# Patient Record
Sex: Male | Born: 1985 | Race: White | Hispanic: No | Marital: Married | Smoking: Former smoker
Health system: Southern US, Community
[De-identification: ages and names within clinical notes are randomized; demographics above are authoritative.]

## PROBLEM LIST (undated history)

## (undated) DIAGNOSIS — G2581 Restless legs syndrome: Secondary | ICD-10-CM

## (undated) HISTORY — PX: HERNIA REPAIR: SHX51

---
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
## 2003-01-21 ENCOUNTER — Emergency Department (HOSPITAL_COMMUNITY): Admission: EM | Admit: 2003-01-21 | Discharge: 2003-01-21 | Payer: Self-pay | Admitting: Emergency Medicine

## 2003-01-21 ENCOUNTER — Encounter: Payer: Self-pay | Admitting: Emergency Medicine

## 2004-08-28 ENCOUNTER — Encounter: Admission: RE | Admit: 2004-08-28 | Discharge: 2004-08-28 | Payer: Self-pay | Admitting: Occupational Medicine

## 2006-11-03 ENCOUNTER — Emergency Department: Payer: Self-pay

## 2007-02-16 ENCOUNTER — Emergency Department (HOSPITAL_COMMUNITY): Admission: EM | Admit: 2007-02-16 | Discharge: 2007-02-16 | Payer: Self-pay | Admitting: Emergency Medicine

## 2007-11-26 ENCOUNTER — Emergency Department (HOSPITAL_COMMUNITY): Admission: EM | Admit: 2007-11-26 | Discharge: 2007-11-26 | Payer: Self-pay | Admitting: Emergency Medicine

## 2008-05-29 ENCOUNTER — Emergency Department (HOSPITAL_COMMUNITY): Admission: EM | Admit: 2008-05-29 | Discharge: 2008-05-29 | Payer: Self-pay | Admitting: Emergency Medicine

## 2010-02-10 ENCOUNTER — Emergency Department (HOSPITAL_COMMUNITY): Admission: EM | Admit: 2010-02-10 | Discharge: 2010-02-10 | Payer: Self-pay | Admitting: Family Medicine

## 2010-09-13 ENCOUNTER — Inpatient Hospital Stay (INDEPENDENT_AMBULATORY_CARE_PROVIDER_SITE_OTHER)
Admission: RE | Admit: 2010-09-13 | Discharge: 2010-09-13 | Disposition: A | Discharge status: Home / Self Care | Payer: PRIVATE HEALTH INSURANCE | Source: Ambulatory Visit | Attending: Family Medicine | Admitting: Family Medicine

## 2010-09-13 DIAGNOSIS — T148XXA Other injury of unspecified body region, initial encounter: Secondary | ICD-10-CM

## 2012-06-14 HISTORY — PX: VASECTOMY: SHX75

## 2013-01-22 ENCOUNTER — Encounter (HOSPITAL_COMMUNITY): Payer: Self-pay

## 2013-01-22 ENCOUNTER — Emergency Department (HOSPITAL_COMMUNITY)
Admission: EM | Admit: 2013-01-22 | Discharge: 2013-01-22 | Disposition: A | Discharge status: Home / Self Care | Payer: 59 | Source: Home / Self Care

## 2013-01-22 ENCOUNTER — Emergency Department (INDEPENDENT_AMBULATORY_CARE_PROVIDER_SITE_OTHER): Payer: 59

## 2013-01-22 DIAGNOSIS — M25569 Pain in unspecified knee: Secondary | ICD-10-CM

## 2013-01-22 DIAGNOSIS — M25562 Pain in left knee: Secondary | ICD-10-CM

## 2013-01-22 MED ORDER — TRAMADOL HCL 50 MG PO TABS: 50.0000 mg | ORAL_TABLET | Freq: Four times a day (QID) | ORAL | Status: DC | PRN

## 2013-01-22 MED ORDER — IBUPROFEN 800 MG PO TABS: 800.0000 mg | ORAL_TABLET | Freq: Three times a day (TID) | ORAL | Status: DC | PRN

## 2013-01-22 MED ORDER — DOXYCYCLINE HYCLATE 100 MG PO CAPS: 100.0000 mg | ORAL_CAPSULE | Freq: Two times a day (BID) | ORAL | Status: DC

## 2013-01-22 NOTE — ED Notes (Signed)
C/o pain and swelling in his left knee x 1 week; no known injury

## 2013-01-22 NOTE — ED Notes (Signed)
Discussed medication compliance and precautions

## 2013-01-22 NOTE — ED Provider Notes (Signed)
CSN: [id number]     Arrival date & time 01/22/13  1641 History     First MD Initiated Contact with Patient 01/22/13 1704     Chief Complaint  Patient presents with  . Knee Pain   (Consider location/radiation/quality/duration/timing/severity/associated sxs/prior Treatment) HPI Comments: 27 year old male presents complaining of progressive knee pain and swelling in his left lower knee for one week. The pain is severe constant and radiates up towards his groin. It is worse at night, feeling like he has needles stabbing into his thigh. The pain in his knee is worse with any ambulation and is somewhat relieved by rest. He is taking numerous over-the-counter anti-inflammatories without relief of his symptoms. He denies any previous history of similar symptoms, fever, chills, night sweats, weight loss.  Patient is a 27 y.o. male presenting with knee pain.  Knee Pain Associated symptoms: no fatigue, no fever and no neck pain     History reviewed. No pertinent past medical history. History reviewed. No pertinent past surgical history. No family history on file. History  Substance Use Topics  . Smoking status: Not on file  . Smokeless tobacco: Not on file  . Alcohol Use: Not on file    Review of Systems  Constitutional: Negative for fever, chills and fatigue.  HENT: Negative for sore throat, neck pain and neck stiffness.   Eyes: Negative for visual disturbance.  Respiratory: Negative for cough and shortness of breath.   Cardiovascular: Negative for chest pain, palpitations and leg swelling.  Gastrointestinal: Negative for nausea, vomiting, abdominal pain, diarrhea and constipation.  Genitourinary: Negative for dysuria, urgency, frequency and hematuria.  Musculoskeletal: Positive for arthralgias (see history of present illness). Negative for myalgias.  Skin: Negative for rash.  Neurological: Negative for dizziness, weakness and light-headedness.    Allergies  Review of patient's  allergies indicates no known allergies.  Home Medications   Current Outpatient Rx  Name  Route  Sig  Dispense  Refill  . doxycycline (VIBRAMYCIN) 100 MG capsule   Oral   Take 1 capsule (100 mg total) by mouth 2 (two) times daily.   20 capsule   0   . ibuprofen (ADVIL,MOTRIN) 800 MG tablet   Oral   Take 1 tablet (800 mg total) by mouth 3 (three) times daily as needed for pain.   60 tablet   0   . traMADol (ULTRAM) 50 MG tablet   Oral   Take 1 tablet (50 mg total) by mouth every 6 (six) hours as needed for pain.   30 tablet   0    BP 140/87  Pulse 75  Temp(Src) 99.3 F (37.4 C) (Oral)  Resp 18  SpO2 95% Physical Exam  Nursing note and vitals reviewed. Constitutional: He is oriented to person, place, and time. He appears well-developed and well-nourished. No distress.  HENT:  Head: Normocephalic and atraumatic.  Eyes: Conjunctivae are normal. Pupils are equal, round, and reactive to light.  Musculoskeletal:       Left knee: He exhibits swelling (tibial plateau), erythema and bony tenderness (tibial plateau). He exhibits normal range of motion, no effusion and no ecchymosis. No tenderness found.       Left ankle: He exhibits no swelling.       Left upper leg: He exhibits no tenderness, no swelling, no edema and no deformity.       Left foot: He exhibits no swelling.  Neurological: He is alert and oriented to person, place, and time. Coordination normal.  Skin: Skin is  warm and dry. No rash noted. He is not diaphoretic.  Psychiatric: He has a normal mood and affect. Judgment normal.    ED Course   Procedures (including critical care time)  Labs Reviewed - No data to display Dg Knee Complete 4 Views Left  01/22/2013   *RADIOLOGY REPORT*  Clinical Data: Pain and swelling  LEFT KNEE - COMPLETE 4+ VIEW  Comparison: None.  Findings: Frontal, lateral, and bilateral oblique views were obtained.  There is no fracture, dislocation, or effusion.  Joint spaces appear intact.  No  erosive change.  IMPRESSION: No abnormality noted.   Original Report Authenticated By: Bretta Bang, M.D.   Ultrasound performed by Dr. Denyse Amass: This appears to be a resolving hematoma or infrapatellar bursitis.  1. Knee pain, left     MDM  The erythema and tenderness associated with this condition may indicate infection. I will start him on doxycycline as well as symptomatic management. If this does not improve, he will followup here or with orthopedics. If this worsens, he will followup here or in the emergency department immediately.   Meds ordered this encounter  Medications  . doxycycline (VIBRAMYCIN) 100 MG capsule    Sig: Take 1 capsule (100 mg total) by mouth 2 (two) times daily.    Dispense:  20 capsule    Refill:  0  . traMADol (ULTRAM) 50 MG tablet    Sig: Take 1 tablet (50 mg total) by mouth every 6 (six) hours as needed for pain.    Dispense:  30 tablet    Refill:  0  . ibuprofen (ADVIL,MOTRIN) 800 MG tablet    Sig: Take 1 tablet (800 mg total) by mouth 3 (three) times daily as needed for pain.    Dispense:  60 tablet    Refill:  0     Graylon Good, PA-C 01/22/13 1818

## 2013-01-22 NOTE — ED Provider Notes (Signed)
Limited musculoskeletal ultrasound of the left knee mass.  Anterior mass located.  Ultrasound probe placed over the mass. Appears to be a heterogeneous mass approximately 3 cm in length and 1 cm in depth. This is superficial to the bone. No calcifications within the mass.  The appearance of this is characteristic of resolving hematoma.  Clementeen Graham, MD  Rodolph Bong, MD 01/22/13 [phone]

## 2013-01-25 NOTE — ED Provider Notes (Signed)
Medical screening examination/treatment/procedure(s) were performed by a resident physician or non-physician practitioner and as the supervising physician I was immediately available for consultation/collaboration.  Kennie Snedden, MD   Lynnann Knudsen S Nahomy Limburg, MD 01/25/13 1447 

## 2013-03-01 ENCOUNTER — Ambulatory Visit: Payer: Self-pay | Admitting: Family Medicine

## 2013-03-01 DIAGNOSIS — Z0289 Encounter for other administrative examinations: Secondary | ICD-10-CM

## 2013-03-23 ENCOUNTER — Encounter: Payer: Self-pay | Admitting: Family Medicine

## 2013-03-23 ENCOUNTER — Ambulatory Visit (INDEPENDENT_AMBULATORY_CARE_PROVIDER_SITE_OTHER): Payer: 59 | Admitting: Family Medicine

## 2013-03-23 VITALS — BP 136/78 | HR 75 | Temp 98.0°F | Ht 72.0 in | Wt 213.5 lb

## 2013-03-23 DIAGNOSIS — F17200 Nicotine dependence, unspecified, uncomplicated: Secondary | ICD-10-CM

## 2013-03-23 DIAGNOSIS — M25569 Pain in unspecified knee: Secondary | ICD-10-CM

## 2013-03-23 DIAGNOSIS — M2550 Pain in unspecified joint: Secondary | ICD-10-CM

## 2013-03-23 DIAGNOSIS — G4700 Insomnia, unspecified: Secondary | ICD-10-CM

## 2013-03-23 DIAGNOSIS — R5381 Other malaise: Secondary | ICD-10-CM

## 2013-03-23 DIAGNOSIS — R5383 Other fatigue: Secondary | ICD-10-CM | POA: Insufficient documentation

## 2013-03-23 DIAGNOSIS — M25562 Pain in left knee: Secondary | ICD-10-CM

## 2013-03-23 DIAGNOSIS — N529 Male erectile dysfunction, unspecified: Secondary | ICD-10-CM | POA: Insufficient documentation

## 2013-03-23 DIAGNOSIS — Z720 Tobacco use: Secondary | ICD-10-CM

## 2013-03-23 MED ORDER — OXYCODONE-ACETAMINOPHEN 5-325 MG PO TABS: 1.0000 | ORAL_TABLET | ORAL | Status: DC | PRN

## 2013-03-23 MED ORDER — BUPROPION HCL ER (SMOKING DET) 150 MG PO TB12: 150.0000 mg | ORAL_TABLET | Freq: Two times a day (BID) | ORAL | Status: DC

## 2013-03-23 MED ORDER — OXYCODONE-ACETAMINOPHEN 10-325 MG PO TABS: 1.0000 | ORAL_TABLET | Freq: Three times a day (TID) | ORAL | Status: DC | PRN

## 2013-03-23 NOTE — Progress Notes (Signed)
Subjective:    Patient ID: Jesse Rubio, male    DOB: 05-Dec-1985, 27 y.o.   MRN: [id number]  HPI  27 yo pleasant male presents with his wife to discuss several issues:  1.  Left knee pain- in 01/2013, woke up and noticed a very painful mass on his left knee.  It was red and it appeared the redness and pain were moving up his thigh. Went to [facility]- note reviewed. Saw Dr. Clementeen Graham on 01/22/2013.  Knee xray unremarkable but knee ultrasound showed 3 cm x 3 cm hematoma.  Given course of doxycyline, Tramdol and Iubprofen.  Nothing has helped.  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 It has been less red but and somewhat less swollen but otherwise has not changed.  Pain wakes him up at night at times.  .Dg Knee Complete 4 Views Left  01/22/2013 *RADIOLOGY REPORT* Clinical Data: Pain and swelling LEFT KNEE - COMPLETE 4+ VIEW Comparison: None. Findings: Frontal, lateral, and bilateral oblique views were obtained. There is no fracture, dislocation, or effusion. Joint spaces appear intact. No erosive change. IMPRESSION: No abnormality noted.    2.  Polyarthralgia- for months, joint pain, intermittent in elbows, knees, feet, hips.  Also has been very fatigued.  No joint redness.  Mom has OA but no known rheum disorders in family.  3.  Fatigue- "very tired all the time" for over a year.  Not sleeping well- takes a sleep aid like Tylenol PM and usually wakes up once or twice a night.  He does snore. No night sweats.  No SOB. No DOE. Wife has never heard him "stop breathing."  4.  Tobacco abuse- 1 ppd for over 10 years.  Has tried to quit- tried chantix but it gave him bad dreams.  Also has tired patches with no success.  He is ready to quit.  5.  ED- upon questioning for his fatigue- endorses progressive ED for past year.   No longer gets morning erections.  Able to get an erection but often cannot maintain one, even with masturbation.   Feels his relationship  with his wife is healthy.  Patient Active Problem List   Diagnosis Date Noted  . Tobacco abuse 03/23/2013  . Polyarthralgia 03/23/2013  . Other malaise and fatigue 03/23/2013  . Insomnia 03/23/2013  . Erectile dysfunction 03/23/2013  . Left knee pain 03/23/2013   No past medical history on file. Past Surgical History  Procedure Laterality Date  . Vasectomy  2014   History  Substance Use Topics  . Smoking status: Current Every Day Smoker -- 1.00 packs/day  . Smokeless tobacco: Not on file  . Alcohol Use: Yes     Comment: occasional   No family history on file. Allergies  Allergen Reactions  . Chantix [Varenicline]     Disturbing dreams   No current outpatient prescriptions on file prior to visit.   No current facility-administered medications on file prior to visit.   The PMH, PSH, Social History, Family History, Medications, and allergies have been reviewed in [facility], and have been updated if relevant.   Review of Systems  Constitutional: Positive for fatigue. Negative for fever, chills, diaphoresis, activity change, appetite change and unexpected weight change.  Respiratory: Negative for choking and chest tightness.   Cardiovascular: Negative for chest pain and leg swelling.  Gastrointestinal: Negative for abdominal distention.  Genitourinary: Negative for dysuria, discharge, penile swelling, scrotal swelling, difficulty urinating,  penile pain and testicular pain.  Musculoskeletal: Positive for arthralgias and back pain. Negative for gait problem, joint swelling and myalgias.  Psychiatric/Behavioral: Negative.       See HPI Objective:   Physical Exam BP 136/78  Pulse 75  Temp(Src) 98 F (36.7 C) (Oral)  Ht 6' (1.829 m)  Wt 213 lb 8 oz (96.843 kg)  BMI 28.95 kg/m2  SpO2 98% Gen:  Alert, pleasant, NAD HEENT:  MMM No adenopathy Resp:  CTA bilaterally CVS:  RRR Abd:  Soft, NT Musculoskeletal:  Left knee: swelling of left tibial plateau, erythema, mild bony  tenderness, otherwise unremarkable.  Ext:  No edema Psych:  Good eye contact, not anxious or depressed appearing Neuro:  Normal gait, CN grossly intact      Assessment & Plan:  1. Tobacco abuse Smoking cessation instruction/counseling given:  counseled patient on the dangers of tobacco use, advised patient to stop smoking, and reviewed strategies to maximize success He does agree to trial of Zyban- eRx sent to pharmacy.  2. Polyarthralgia Given polyarthralgia with complaints of fatigue, we do need to rule out rheum arthritis.  Orders entered. - CBC with Differential; Future - ANA; Future - Comprehensive metabolic panel; Future - Cyclic Citrul Peptide Antibody, IGG; Future - Sedimentation Rate; Future - Rheumatoid Factor; Future  3. Other malaise and fatigue Likely multifactorial but needs to be worked up.  See above. - CBC with Differential; Future - ANA; Future - Comprehensive metabolic panel; Future - Cyclic Citrul Peptide Antibody, IGG; Future - Sedimentation Rate; Future - Rheumatoid Factor; Future  4. Insomnia With snoring.  Will order labs first.  If unremarkable, consider sleep study. The patient indicates understanding of these issues and agrees with the plan.   5. Erectile dysfunction Concerning for organic impotence given that he is no longer having morning erections and difficulty maintaining orgasm even with self stimulation.  Will check am testosterone and prolactin.  May need endo referral. The patient indicates understanding of these issues and agrees with the plan.  - Testosterone; Future - Prolactin; Future  6. Left knee pain With hematoma.  Will refer to Dr. Patsy Lager for evaluation and tx. Did given him #20 percocet for severe pain.

## 2013-03-23 NOTE — Patient Instructions (Addendum)
It was nice to meet you. Please schedule an early morning( fasting) lab visit at your convenience. Please also schedule an appointment with Dr. Patsy Lager on your way out.  We are starting Zyban for smoking cessation- I am proud of you for wanting to quit!  Ok to take as need Percocet for severe pain.  This will make you sleepy.  Please do not take before driving or operating machinery.

## 2013-03-26 ENCOUNTER — Other Ambulatory Visit: Payer: 59

## 2013-03-28 ENCOUNTER — Ambulatory Visit: Payer: 59

## 2013-03-28 ENCOUNTER — Other Ambulatory Visit (INDEPENDENT_AMBULATORY_CARE_PROVIDER_SITE_OTHER): Payer: 59

## 2013-03-28 ENCOUNTER — Encounter: Payer: Self-pay | Admitting: Family Medicine

## 2013-03-28 ENCOUNTER — Ambulatory Visit (INDEPENDENT_AMBULATORY_CARE_PROVIDER_SITE_OTHER): Payer: 59 | Admitting: Family Medicine

## 2013-03-28 ENCOUNTER — Encounter: Payer: Self-pay | Admitting: Radiology

## 2013-03-28 VITALS — BP 110/90 | HR 62 | Temp 98.1°F | Ht 72.0 in | Wt 210.0 lb

## 2013-03-28 DIAGNOSIS — M25569 Pain in unspecified knee: Secondary | ICD-10-CM

## 2013-03-28 DIAGNOSIS — R5381 Other malaise: Secondary | ICD-10-CM

## 2013-03-28 DIAGNOSIS — M2550 Pain in unspecified joint: Secondary | ICD-10-CM

## 2013-03-28 DIAGNOSIS — N529 Male erectile dysfunction, unspecified: Secondary | ICD-10-CM

## 2013-03-28 DIAGNOSIS — R5383 Other fatigue: Secondary | ICD-10-CM

## 2013-03-28 DIAGNOSIS — M25562 Pain in left knee: Secondary | ICD-10-CM

## 2013-03-28 DIAGNOSIS — G4700 Insomnia, unspecified: Secondary | ICD-10-CM

## 2013-03-28 LAB — CBC WITH DIFFERENTIAL/PLATELET
Basophils Absolute: 0 10*3/uL (ref 0.0–0.1)
Basophils Relative: 0.5 % (ref 0.0–3.0)
Eosinophils Absolute: 0.4 10*3/uL (ref 0.0–0.7)
Lymphocytes Relative: 29.4 % (ref 12.0–46.0)
MCHC: 34.5 g/dL (ref 30.0–36.0)
Neutrophils Relative %: 59.5 % (ref 43.0–77.0)
Platelets: 383 10*3/uL (ref 150.0–400.0)
RBC: 4.91 Mil/uL (ref 4.22–5.81)
RDW: 12.7 % (ref 11.5–14.6)

## 2013-03-28 LAB — COMPREHENSIVE METABOLIC PANEL
ALT: 29 U/L (ref 0–53)
AST: 27 U/L (ref 0–37)
Albumin: 4.3 g/dL (ref 3.5–5.2)
Alkaline Phosphatase: 71 U/L (ref 39–117)
Potassium: 4.5 mEq/L (ref 3.5–5.1)
Sodium: 140 mEq/L (ref 135–145)
Total Protein: 8.1 g/dL (ref 6.0–8.3)

## 2013-03-28 LAB — TESTOSTERONE: Testosterone: 321.18 ng/dL — ABNORMAL LOW (ref 350.00–890.00)

## 2013-03-28 NOTE — Progress Notes (Signed)
Date:  03/28/2013   Name:  Jesse Rubio   DOB:  [birth year]   MRN:  [id number] Gender: male Age: 27 y.o.  Primary Physician: Ruthe Mannan, MD  Dear Dr. Dayton Martes,  Thank you for having me see Jesse Rubio in consultation today at [facility] for his problem with knee pain that has been ongoing now for 3 months.  As you may recall, he is a 27 y.o. 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 On 01/22/2013, the patient was evaluated at [facility] urgent care, placed on 10 days of doxycycline, given ibuprofen, and ultram.    Per the patient's report, about three or 4 months ago, he did not hit it and had a knot on his knee and started to grow and hurting. His wife describes this initially looking like a "pimple." Subsequently, they report that it become larger, more painful, and it got red and hot to touch. Now pain is staying just in his kneecap. Pain shooting up leg has gone away. Knee still hurts, but he has been able to continue working.   It did improve after taking motrin and doxycycline.   No significant knee history, fracture, or surgery.  No past medical history on file.  Past Surgical History  Procedure Laterality Date  . Vasectomy  2014    History   Social History  . Marital Status: Married    Spouse Name: N/A    Number of Children: N/A  . Years of Education: N/A   Social History Main Topics  . Smoking status: Current Every Day Smoker -- 1.00 packs/day  . Smokeless tobacco: Former Neurosurgeon  . Alcohol Use: Yes     Comment: occasional  . Drug Use: No  . Sexual Activity: Not on file   Other Topics Concern  . Not on file   Social History Narrative  . No narrative on file    No family history on file.  Medications and Allergies reviewed  Outpatient Prescriptions Prior to Visit  Medication Sig Dispense Refill  . buPROPion (ZYBAN) 150 MG 12 hr tablet Take 1 tablet (150 mg total) by mouth 2 (two)  times daily.  60 tablet  0  . oxyCODONE-acetaminophen (ROXICET) 5-325 MG per tablet Take 1 tablet by mouth every 4 (four) hours as needed for pain.  30 tablet  0   No facility-administered medications prior to visit.    Review of Systems:    GEN: No fevers, chills. Nontoxic. Primarily MSK c/o today. MSK: Detailed in the HPI, he also has been having diffuse arthralgias and myalgias over the last few months. GI: tolerating PO intake without difficulty Neuro: No numbness, parasthesias, or tingling associated. He also has been having a lot of stress Currently with some libido issues and occ ED also Otherwise, the pertinent positives and negatives are listed above and in the HPI, otherwise a full review of systems has been reviewed and is negative unless noted positive.     Physical Examination: Filed Vitals:   03/28/13 0901  BP: 110/90  Pulse: 62  Temp: 98.1 F (36.7 C)  TempSrc: Oral  Height: 6' (1.829 m)  Weight: 210 lb (95.255 kg)    GEN: WDWN, NAD, Non-toxic, Alert & Oriented x 3 HEENT: Atraumatic, Normocephalic.  Ears and Nose: No external deformity. EXTR: No clubbing/cyanosis/edema NEURO: Normal gait.  PSYCH: Normally interactive. Conversant. Not depressed or anxious appearing.  Calm demeanor.  Knee:  LEFT Gait: Normal heel toe pattern ROM: 0-130 Effusion: neg Echymosis or edema: none Tibial tubercle does have some slight color change, no swelling, minimally to non-tender currently Patellar tendon NT Painful PLICA: neg Patellar grind: mild Medial and lateral patellar facet loading: mild TTP medial and lateral joint lines: mild tenderness Mcmurray's neg Flexion-pinch mild-mod pain Varus and valgus stress: stable Lachman: neg Ant and Post drawer: neg Hip abduction, IR, ER: WNL Hip flexion str: 5/5 Hip abd: 5/5 Quad: 5/5 VMO atrophy:No Hamstring concentric and eccentric: 5/5    01/22/2013 Imaging: Limited musculoskeletal ultrasound of the left knee mass.     Anterior mass located.   Ultrasound probe placed over the mass. Appears to be a heterogeneous mass approximately 3 cm in length and 1 cm in depth. This is superficial to the bone. No calcifications within the mass.   The appearance of this is characteristic of resolving hematoma.  Clementeen Graham, MD   Rodolph Bong, MD 01/22/2013   *RADIOLOGY REPORT*  Clinical Data: Pain and swelling  LEFT KNEE - COMPLETE 4+ VIEW  Comparison: None.  Findings: Frontal, lateral, and bilateral oblique views were obtained.  There is no fracture, dislocation, or effusion.  Joint spaces appear intact.  No erosive change.  IMPRESSION: No abnormality noted.   Original Report Authenticated By: Bretta Bang, M.D.    Ultrasound performed by Dr. Denyse Amass: This appears to be a resolving hematoma or infrapatellar bursitis. Limited musculoskeletal ultrasound of the left knee mass.   Anterior mass located.   Ultrasound probe placed over the mass. Appears to be a heterogeneous mass approximately 3 cm in length and 1 cm in depth. This is superficial to the bone. No calcifications within the mass.   The appearance of this is characteristic of resolving hematoma.  Clementeen Graham, MD   Rodolph Bong, MD 01/22/2013   *RADIOLOGY REPORT*  Clinical Data: Pain and swelling  LEFT KNEE - COMPLETE 4+ VIEW  Comparison: None.  Findings: Frontal, lateral, and bilateral oblique views were obtained.  There is no fracture, dislocation, or effusion.  Joint spaces appear intact.  No erosive change.  IMPRESSION: No abnormality noted.   Original Report Authenticated By: Bretta Bang, M.D.    Ultrasound performed by Dr. Denyse Amass: This appears to be a resolving hematoma or infrapatellar bursitis.  Results for orders placed in visit on 03/28/13  B. BURGDORFI ANTIBODIES      Result Value Range   B burgdorferi Ab IgG+IgM 0.46     Lab Results  Component Value Date   CRP 1.6 03/28/2013    Lab Results  Component Value Date   ESRSEDRATE 10 03/28/2013    Lab Results  Component Value Date   ANA NEG 03/28/2013   RF <10 03/28/2013    Comprehensive Metabolic Panel:    Component Value Date/Time   NA 140 03/28/2013 0839   K 4.5 03/28/2013 0839   CL 104 03/28/2013 0839   CO2 28 03/28/2013 0839   BUN 12 03/28/2013 0839   CREATININE 1.0 03/28/2013 0839   GLUCOSE 116* 03/28/2013 0839   CALCIUM 9.6 03/28/2013 0839   AST 27 03/28/2013 0839   ALT 29 03/28/2013 0839   ALKPHOS 71 03/28/2013 0839   BILITOT 0.8 03/28/2013 0839   PROT 8.1 03/28/2013 0839   ALBUMIN 4.3 03/28/2013 0839   CCP < 2, normal  Assessment and Plan:  Impression: 1. Ongoing left knee pain 2. Diffuse polyarthralgias 3. I question given history if initial presentation may have been  an abscess.   Recommendations: 1. Add CRP and Lyme titers to pending blood in lab, normal 2. Obtain an MRI of the left knee to evaluate for any cartilage injury, meniscal injury, T2 signal of bone, which could suggest underlying infectious process.   We will see the patient back based on the results of his imaging.  Thank you for having [place] see Jesse Rubio in consultation.  Feel free to contact me with any questions.  Signed,  Elpidio Galea. Anelia Carriveau, MD, CAQ Sports Medicine  [employer] at [facility] [address] [place], [place] [address] Phone: [phone] Fax: [phone]

## 2013-03-29 LAB — PROLACTIN: Prolactin: 9.5 ng/mL (ref 2.1–17.1)

## 2013-03-29 LAB — B. BURGDORFI ANTIBODIES: B burgdorferi Ab IgG+IgM: 0.46 {ISR}

## 2013-03-29 LAB — ANA: Anti Nuclear Antibody(ANA): NEGATIVE

## 2013-03-30 ENCOUNTER — Other Ambulatory Visit: Payer: Self-pay | Admitting: Family Medicine

## 2013-03-30 ENCOUNTER — Encounter: Payer: Self-pay | Admitting: Family Medicine

## 2013-03-30 DIAGNOSIS — E291 Testicular hypofunction: Secondary | ICD-10-CM

## 2013-04-13 ENCOUNTER — Ambulatory Visit
Admission: RE | Admit: 2013-04-13 | Discharge: 2013-04-13 | Disposition: A | Discharge status: Home / Self Care | Payer: 59 | Source: Ambulatory Visit | Attending: Family Medicine | Admitting: Family Medicine

## 2013-04-13 DIAGNOSIS — M25562 Pain in left knee: Secondary | ICD-10-CM

## 2013-04-13 DIAGNOSIS — M2550 Pain in unspecified joint: Secondary | ICD-10-CM

## 2013-04-19 ENCOUNTER — Other Ambulatory Visit: Payer: Self-pay

## 2013-04-20 ENCOUNTER — Encounter: Payer: Self-pay | Admitting: Radiology

## 2013-04-23 ENCOUNTER — Ambulatory Visit: Payer: 59 | Admitting: Family Medicine

## 2013-04-23 DIAGNOSIS — Z0289 Encounter for other administrative examinations: Secondary | ICD-10-CM

## 2013-04-29 ENCOUNTER — Other Ambulatory Visit: Payer: Self-pay | Admitting: Family Medicine

## 2013-07-01 ENCOUNTER — Emergency Department (HOSPITAL_COMMUNITY)
Admission: EM | Admit: 2013-07-01 | Discharge: 2013-07-01 | Disposition: A | Discharge status: Home / Self Care | Payer: 59 | Attending: Emergency Medicine | Admitting: Emergency Medicine

## 2013-07-01 ENCOUNTER — Encounter (HOSPITAL_COMMUNITY): Payer: Self-pay | Admitting: Emergency Medicine

## 2013-07-01 DIAGNOSIS — F17200 Nicotine dependence, unspecified, uncomplicated: Secondary | ICD-10-CM | POA: Insufficient documentation

## 2013-07-01 DIAGNOSIS — L089 Local infection of the skin and subcutaneous tissue, unspecified: Secondary | ICD-10-CM

## 2013-07-01 DIAGNOSIS — H05229 Edema of unspecified orbit: Secondary | ICD-10-CM | POA: Insufficient documentation

## 2013-07-01 DIAGNOSIS — Z79899 Other long term (current) drug therapy: Secondary | ICD-10-CM | POA: Insufficient documentation

## 2013-07-01 DIAGNOSIS — L03211 Cellulitis of face: Principal | ICD-10-CM | POA: Insufficient documentation

## 2013-07-01 DIAGNOSIS — L0201 Cutaneous abscess of face: Secondary | ICD-10-CM | POA: Insufficient documentation

## 2013-07-01 MED ORDER — CEPHALEXIN 500 MG PO CAPS: 500.0000 mg | ORAL_CAPSULE | Freq: Two times a day (BID) | ORAL | Status: DC

## 2013-07-01 NOTE — ED Notes (Signed)
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  Pt states he's been picking at it.  Day before yesterday he was able to "mash" it and reports a large amount of pus came out of it.  This morning pt woke up and has L eye swelling.

## 2013-07-01 NOTE — ED Provider Notes (Signed)
CSN: [id number]     Arrival date & time 07/01/13  0918 History  This chart was scribed for non-physician practitioner, Vernie Murders, PA-C working with Tanna Furry, MD by Frederich Balding, ED scribe. This patient was seen in room TR06C/TR06C and the patient's care was started at 9:44 AM.   Chief Complaint  Patient presents with  . Facial Swelling   The history is provided by the patient. No language interpreter was used.   HPI Comments: Jesse Rubio is a 28 y.o. male who presents to the Emergency Department complaining of a painless mass above his left eyebrow, which has been present for several years.  Patient states that two days ago he tried to "pop" the mass with a needle.  He describes a large amount of yellow pus drained from the wound. He states he now has developed overlying redness, swelling, and pain.  He states the swelling has now spread to his left eye.  He denies any fever, eye pain, ear pain, headache, sore throat, neck pain, rhinorrhea, or nasal congestion.  He denies any hx of abscesses in the past.  No hx of DM or other health conditions.  Tetanus up to date.    No past medical history on file. Past Surgical History  Procedure Laterality Date  . Vasectomy  2014   No family history on file. History  Substance Use Topics  . Smoking status: Current Every Day Smoker -- 1.00 packs/day  . Smokeless tobacco: Former Systems developer  . Alcohol Use: Yes     Comment: occasional    Review of Systems  Constitutional: Negative for fever, chills, activity change, appetite change and fatigue.  HENT: Positive for facial swelling. Negative for congestion, ear pain, rhinorrhea, sore throat and trouble swallowing.   Eyes: Negative for photophobia, pain, discharge, redness and visual disturbance.  Gastrointestinal: Negative for nausea, vomiting and abdominal pain.  Skin: Positive for color change and wound.       Abscess.  Neurological: Negative for weakness and headaches.  All other systems  reviewed and are negative.   Allergies  Chantix  Home Medications   Current Outpatient Rx  Name  Route  Sig  Dispense  Refill  . buPROPion (WELLBUTRIN SR) 150 MG 12 hr tablet      TAKE 1 TABLET (150 MG TOTAL) BY MOUTH 2 (TWO) TIMES DAILY.   60 tablet   2   . oxyCODONE-acetaminophen (ROXICET) 5-325 MG per tablet   Oral   Take 1 tablet by mouth every 4 (four) hours as needed for pain.   30 tablet   0    BP 140/96  Pulse 81  Temp(Src) 97.4 F (36.3 C) (Oral)  Resp 14  Ht 6' (1.829 m)  Wt 210 lb (95.255 kg)  BMI 28.47 kg/m2  SpO2 99%  Filed Vitals:   07/01/13 0923  BP: 140/96  Pulse: 81  Temp: 97.4 F (36.3 C)  TempSrc: Oral  Resp: 14  Height: 6' (1.829 m)  Weight: 210 lb (95.255 kg)  SpO2: 99%    Physical Exam  Nursing note and vitals reviewed. Constitutional: He is oriented to person, place, and time. He appears well-developed and well-nourished. No distress.  HENT:  Head: Normocephalic and atraumatic.    Right Ear: Tympanic membrane, external ear and ear canal normal.  Left Ear: Tympanic membrane, external ear and ear canal normal.  Nose: Nose normal.  Mouth/Throat: Uvula is midline, oropharynx is clear and moist and mucous membranes are normal. No oropharyngeal exudate.  1.5 cm circular fluctuant mass with overlying erythema and no open wounds or evidence of drainage superficial to the left eyebrow. Tympanic membranes gray and translucent bilaterally with no erythema, edema, or hemotympanum.  No erythema or exudates to the posterior pharynx.  Uvula midline.  No trismus.   Eyes: Conjunctivae and EOM are normal. Pupils are equal, round, and reactive to light.  Mild periorbital edema to the left eye with no erythema.  No evidence of foreign body or mass to the eyelids.  No pain with eye movement.    Neck: Normal range of motion. Neck supple. No tracheal deviation present.  No cervical spinal or paraspinal tenderness to palpation throughout.  No limitations  with neck ROM.    Cardiovascular: Normal rate, regular rhythm and normal heart sounds.  Exam reveals no gallop and no friction rub.   No murmur heard. Pulmonary/Chest: Effort normal and breath sounds normal. No respiratory distress. He has no wheezes. He has no rales. He exhibits no tenderness.  Musculoskeletal: Normal range of motion. He exhibits no edema and no tenderness.  Neurological: He is alert and oriented to person, place, and time.  Skin: Skin is warm and dry. He is not diaphoretic.  Psychiatric: He has a normal mood and affect. His behavior is normal.    ED Course  INCISION AND DRAINAGE Date/Time: 07/01/2013 10:00 AM Performed by: Vernie Murders K Authorized by: Lucila Maine Consent: Verbal consent obtained. Consent given by: patient Patient identity confirmed: verbally with patient Type: abscess Body area: head/neck Location details: face Anesthesia: local infiltration Local anesthetic: lidocaine 2% without epinephrine Anesthetic total: 2 ml Patient sedated: no Scalpel size: 11 Incision type: elliptical and single straight Complexity: simple Drainage: bloody Drainage amount: scant Wound treatment: wound left open Patient tolerance: Patient tolerated the procedure well with no immediate complications.   (including critical care time)  DIAGNOSTIC STUDIES: Oxygen Saturation is 99% on RA, normal by my interpretation.    COORDINATION OF CARE: 9:47 AM-Discussed treatment plan which includes I&D with pt at bedside and pt agreed to plan.    Labs Review Labs Reviewed - No data to display Imaging Review No results found.  EKG Interpretation   None       MDM   Jesse Rubio is a 28 y.o. male who presents to the Emergency Department complaining of a painless mass above his left eyebrow, which has been present for several years.  Etiology of mass possibly due to a cyst vs abscess vs tumor. I&D did not reveal any purulent drainage. Patient may be  developing an overlying cellulitis.  Patient prescribed Keflex. Mild edema to the left eye with no erythema.  Do not suspect peri-orbital or orbital cellulitis.  Patient afebrile and non-toxic in appearance.  Patient instructed to follow-up with PCP for wound re-check.  Return precautions, discharge instructions, and follow-up was discussed with the patient before discharge.     Discharge Medication List as of 07/01/2013 10:09 AM    START taking these medications   Details  cephALEXin (KEFLEX) 500 MG capsule Take 1 capsule (500 mg total) by mouth 2 (two) times daily., Starting 07/01/2013, Until Discontinued, Print         Final impressions: 1. Facial infection     Denman George   I personally performed the services described in this documentation, which was scribed in my presence. The recorded information has been reviewed and is accurate.       Lucila Maine, PA-C 07/02/13  1006 

## 2013-07-01 NOTE — Discharge Instructions (Signed)
You may have an abscess or a mass in your forehead which has become infected  Keep area clean and dry  Apply antibiotic ointment as needed  Follow-up with your doctor for wound re-check  Return to the emergency department if you develop any changing/worsening condition, fever, spreading redness/swelling, drainage of pus, or any other concerns (please read additional information regarding your condition below)    Abscess An abscess is an infected area that contains a collection of pus and debris.It can occur in almost any part of the body. An abscess is also known as a furuncle or boil. CAUSES  An abscess occurs when tissue gets infected. This can occur from blockage of oil or sweat glands, infection of hair follicles, or a minor injury to the skin. As the body tries to fight the infection, pus collects in the area and creates pressure under the skin. This pressure causes pain. People with weakened immune systems have difficulty fighting infections and get certain abscesses more often.  SYMPTOMS Usually an abscess develops on the skin and becomes a painful mass that is red, warm, and tender. If the abscess forms under the skin, you may feel a moveable soft area under the skin. Some abscesses break open (rupture) on their own, but most will continue to get worse without care. The infection can spread deeper into the body and eventually into the bloodstream, causing you to feel ill.  DIAGNOSIS  Your caregiver will take your medical history and perform a physical exam. A sample of fluid may also be taken from the abscess to determine what is causing your infection. TREATMENT  Your caregiver may prescribe antibiotic medicines to fight the infection. However, taking antibiotics alone usually does not cure an abscess. Your caregiver may need to make a small cut (incision) in the abscess to drain the pus. In some cases, gauze is packed into the abscess to reduce pain and to continue draining the  area. HOME CARE INSTRUCTIONS   Only take over-the-counter or prescription medicines for pain, discomfort, or fever as directed by your caregiver.  If you were prescribed antibiotics, take them as directed. Finish them even if you start to feel better.  If gauze is used, follow your caregiver's directions for changing the gauze.  To avoid spreading the infection:  Keep your draining abscess covered with a bandage.  Wash your hands well.  Do not share personal care items, towels, or whirlpools with others.  Avoid skin contact with others.  Keep your skin and clothes clean around the abscess.  Keep all follow-up appointments as directed by your caregiver. SEEK MEDICAL CARE IF:   You have increased pain, swelling, redness, fluid drainage, or bleeding.  You have muscle aches, chills, or a general ill feeling.  You have a fever. MAKE SURE YOU:   Understand these instructions.  Will watch your condition.  Will get help right away if you are not doing well or get worse. Document Released: 03/10/2005 Document Revised: 11/30/2011 Document Reviewed: 08/13/2011 [facility] Patient Information 2014 [place], [place].   Emergency Department Resource Guide 1) Find a Doctor and Pay Out of Pocket Although you won't have to find out who is covered by your insurance plan, it is a good idea to ask around and get recommendations. You will then need to call the office and see if the doctor you have chosen will accept you as a new patient and what types of options they offer for patients who are self-pay. Some doctors offer discounts or will  set up payment plans for their patients who do not have insurance, but you will need to ask so you aren't surprised when you get to your appointment.  2) Contact Your Local Health Department Not all health departments have doctors that can see patients for sick visits, but many do, so it is worth a call to see if yours does. If you don't know where your local  health department is, you can check in your phone book. The CDC also has a tool to help you locate your state's health department, and many state websites also have listings of all of their local health departments.  3) Find a [facility] If your illness is not likely to be very severe or complicated, you may want to try a walk in clinic. These are popping up all over the country in pharmacies, drugstores, and shopping centers. They're usually staffed by nurse practitioners or physician assistants that have been trained to treat common illnesses and complaints. They're usually fairly quick and inexpensive. However, if you have serious medical issues or chronic medical problems, these are probably not your best option.  No Primary Care Doctor: - Call Health Connect at  [phone] - they can help you locate a primary care doctor that  accepts your insurance, provides certain services, etc. - Physician Referral Service- [phone]  Chronic Pain Problems: Organization         Address  Phone   Notes  [facility]  [phone] Patients need to be referred by their primary care doctor.   Medication Assistance: Organization         Address  Phone   Notes  [facility] Medication [facility] [address] [place], [place] [address] [phone] --Must be a resident of [facility] -- Must have NO insurance coverage whatsoever (no Medicaid/ Medicare, etc.) -- The pt. MUST have a primary care doctor that directs their care regularly and follows them in the community   [facility]ist  [phone]   [employer]  [phone]    Agencies that provide inexpensive medical care: Organization         Address  Phone   Notes  [place]  [phone]   [facility]    [phone]   [facility] [place], [place] [address] [phone]   [place] [address] [place] [phone]   Planned Parenthood    [phone]   [facility]    [phone]   [place] and [address], [place] Phone:  [phone], Fax:  [phone] Hours of Operation:  9 am - 6 pm, M-F.  Also accepts Medicaid/Medicare and self-pay.  [facility] for [place], [address], [place] Phone: [phone], Fax: [phone]. Hours of Operation:  8:30 am - 5:30 pm, M-F.  Also accepts Medicaid and self-pay.  [facility] [place] [address], [place] Phone: [phone]   [place], [place], [place] [phone], Ext. [phone] Mondays & Thursdays: 7-9 AM.  First 15 patients are seen on a first come, first serve basis.    [place] Providers:  Organization         Address  Phone   Notes  [facility] [address], [place] [phone]  Also accepts self-pay patients.  [facility] [address], [place]  [phone]   [place], [address], [place] [phone]   [facility] [address], [place] [phone]   Lucianne Lei [address], [place]   [phone] Only accepts [place] Access [place] patients after they have their name applied to their card.   Self-Pay (no insurance) in [facility]:  Organization         Address  Phone   Notes  Sickle Cell Patients, [facility] [place] [phone]   [facility] [place] [phone]   [facility] [place]  [place], [place], [place] [phone]   [facility]/Dr. Osei-Bonsu  [address] [place] or [address], [place] [phone] Phone number for both [place] and  [place] locations is the same.  [facility]gent [facility] and [facility] [address] [place] [phone]   [facility] [address], [place] or [address] [phone]   [facility] [address] [place] [phone], phone; [phone], fax Sees patients 1st and 3rd Saturday of every month.  Must not qualify for public or private insurance (i.e. Medicaid, Medicare, [place] Health Choice, Veterans' Benefits)  Household income should be no more than 200% of the poverty level The clinic cannot treat you if you are pregnant or think you are pregnant  Sexually transmitted diseases are not treated at the clinic.    Dental Care: Organization         Address  Phone  Notes  [facility] Department of [facility] [place] [phone] Accepts children up to age 45 who are enrolled in [place] or [place]; pregnant women with a Medicaid card; and children who have applied for Medicaid or [place] Health Choice, but were declined, whose parents can pay a reduced fee at time of service.  [facility] Department of [facility]  [address], [place] [phone] Accepts children up to age 38 who are enrolled in [place] or [place]; pregnant women with a Medicaid card; and children who have applied for Medicaid or [place] Health Choice, but were declined, whose parents can pay a reduced fee at time of service.  [place] Adult Dental Access PROGRAM  [place] [phone] Patients are seen by appointment only. Walk-ins are not accepted. [place] will see patients 32 years of age and older. Monday - Tuesday (8am-5pm) Most Wednesdays (8:30-5pm) $30 per visit, cash only  [facility] Adult Dental Access PROGRAM  [address], [facility] [phone] Patients are seen by appointment only. Walk-ins are not accepted.  [place] will see patients 19 years of age and older. One Wednesday Evening (Monthly: Volunteer Based).  $30 per visit, cash only  [place]  [phone] for adults; Children under age 53, call Graduate Pediatric Dentistry at [phone]. Children aged 39-14, please call [phone] to request a pediatric application.  Dental services are provided in all areas of dental care including fillings, crowns and bridges, complete and partial dentures, implants, gum treatment, root canals, and extractions. Preventive care is also provided. Treatment  is provided to both adults and children. Patients are selected via a lottery and there is often a waiting list.   [facility] [address], [place]  [phone] [email or web address]   [facility] [address], [place], [place] [phone], Ext. [phone] Second and Fourth Thursday of each month, opens at 6:30 AM; Clinic ends at 9 AM.  Patients are seen on a first-come first-served basis, and a limited number are seen during each clinic.   [facility]  [address], [place], [place] [phone]   Eligibility Requirements You must have lived in [place], [place], or [place] counties for at least the last three months.   You cannot be eligible for state or federal sponsored [place] Cityhealthcare insurance, including CIG[employer]eterans Administration, [place]icaid, or [employer] EntertainmentMedicare.   You generally cannot be eligible for healthcare insurance through your employer.    How to apply: Eligibility screenings are held every Tuesday and Wednesday afternoon from 1:00 pm until 4:00 pm. You do not need an appointment for the interview!  [facility] [address], [place], [place] [phone]   [facility]  [phone]   [facility]  [phone]   [facility]  [phone]    Behavioral Health Resources in the  Community: Intensive Outpatient Programs Organization         Address  Phone  Notes  [facility] [address], [place], [place] [phone]   [facility] [address], [place], [place] [phone]   [facility]: [facility] [address], [place], [place]  [phone]   [facility] [address],  [place], [place] [phone] or [phone]   Substance Abuse Resources Organization         Address  Phone  Notes  [facility] Drug Services  [phone]   [facility]  [phone]   [facility]  [phone]   [facility]  [phone]   Residential & Outpatient Substance Abuse Program  [phone]   Psychological Services Organization         Address  Phone  Notes  [facility]  [phone]- [phone]   [facility]  [phone]- [phone]   [facility] [address], [place] [phone] or [phone]    Mobile Crisis Teams Organization         Address  Phone  Notes  [facility]atives, Mobile Crisis Care Unit  [phone]   [facility]  [address] [place], [place] [phone]   Doristine LocksSharon DeEsch [address] [place] [phone]    Self-Help/Support Groups Organization         Address  Phone             Notes  [facility]oc[facility] of [place] - variety of support groups  336- [phone] Call for more information  Narcotics Anonymous (NA), Caring [facility] [address], [place]   2 meetings at this location   Statisticianesidential Treatment Programs Organization         Address  Phone  Notes  [facility] [address],    [place]  [phone]   [facility]  [address], [place] [phone], [place], [place] [phone]   [facility] [address], [place] [phone] Admissions: 8am-3pm M-F  [facility] [address]    [place],  [place] [phone]   [facility] [address], [place], [place] [phone]   [facility] [address]  [place], [place] [phone]   [facility] - Intensive Outpatient [address], [place], [place] [phone]   [facility] ([facility] Recovery [facility] [address]  [place], [place] [phone] or [phone]   [facility] ([facility]) [address] [place], [place] [phone] Accepts Medicaid  [facility] [address]  [place] [phone] Substance Abuse/Addiction Treatment   [facility] Health Resources Organization         Address  Phone  Notes  [facility]  [phone]   Angie FavaJulie Brannon, PhD [address], [address] [place], [place]   [phone] or [phone]   [facility]   [address] [place], [place] [phone]) [phone]   [facility] [address], [place], [place] [phone]) [phone] Insurance/Medicaid/sponsorship through [facility]  [facility] Families [address]                                    [place], [place] [phone]) [phone] Therapy/tele-psych/case  [facility] [address].   [place], [place] [phone](336) [phone]    Dr. Lolly MustacheArfeen  [phone]   [facility]  [employer] [facility] 1) [address], [place] 2) [address], [place] 3)  [address], [place] [phone](336) [phone]  [phone]   [facility] Child Abuse Hotline [phone]([phone] or [phone](336) [phone] (After Hours)

## 2013-07-02 ENCOUNTER — Telehealth: Payer: Self-pay

## 2013-07-02 NOTE — Telephone Encounter (Signed)
Triage Record Num: [id number] Operator: Domingo Madeira Patient Name: Jesse Rubio Call Date & Time: 07/01/2013 8:30:39AM Patient Phone: [phone] PCP: Patient Gender: Male PCP Fax : Patient DOB: [birth year] Practice Name: [place] Reason for Call: Caller: Jennifer/Spouse; PCP: Arnette Norris (Family Practice); CB#: [phone]; Call regarding Had a small hard bump on forehead, and puss came out, this AM his eye is swollen; Afebrile. Onset 06/29/2013 bump on his forehead, he squeezed it and wife said there was a lot of pus draining from it and blood, the release of pressure felt better. His left eye the next day, 06/30/2013 slightly swollen and now today, 07/01/2013 the left eye is swollen shut. The bump on forehead is really red, not swollen. All emergent signs and symptoms of Face Pain or Swelling protocol ruled out except 'Worsening redness, swelling AND tenderness of tissue around eyes associ8ated with restricted or painful eye movements, bulging eye, or decreased vision related to swelling. RN/CAN advised ER now and he will have [facility]. Protocol(s) Used: Face Pain or Swelling Recommended Outcome per Protocol: See Provider Immediately Reason for Outcome: Worsening redness, swelling AND tenderness of tissue around eyes associated with restricted or painful eye movements, bulging eye, or decreased vision related to swelling Care Advice: ~ Another adult should drive. ~ CAUTIONS Write down provider's name. List or place the following in a bag for transport with the patient: current prescription and/or nonprescription medications; alternative treatments, therapies and medications; and street drugs. ~ 01/

## 2013-07-04 NOTE — ED Provider Notes (Signed)
Medical screening examination/treatment/procedure(s) were performed by non-physician practitioner and as supervising physician I was immediately available for consultation/collaboration.  EKG Interpretation   None         Tanna Furry, MD 07/04/13 1515

## 2013-07-12 ENCOUNTER — Ambulatory Visit (INDEPENDENT_AMBULATORY_CARE_PROVIDER_SITE_OTHER): Payer: 59 | Admitting: Family Medicine

## 2013-07-12 ENCOUNTER — Encounter: Payer: Self-pay | Admitting: Family Medicine

## 2013-07-12 VITALS — BP 116/84 | HR 72 | Temp 98.7°F | Wt 217.5 lb

## 2013-07-12 DIAGNOSIS — G8929 Other chronic pain: Secondary | ICD-10-CM

## 2013-07-12 DIAGNOSIS — M545 Low back pain, unspecified: Secondary | ICD-10-CM | POA: Insufficient documentation

## 2013-07-12 MED ORDER — CYCLOBENZAPRINE HCL 10 MG PO TABS: 10.0000 mg | ORAL_TABLET | Freq: Two times a day (BID) | ORAL | Status: DC | PRN

## 2013-07-12 MED ORDER — NAPROXEN 500 MG PO TABS: ORAL_TABLET | ORAL | Status: DC

## 2013-07-12 NOTE — Patient Instructions (Signed)
I think you have lower back pain from tight muscles with spasm as well as a chronic lumbar strain You may have some arthritis of your sacro iliac joint. Do stretching exercises provided along with using prescription anti inflammatory and muscle relaxant. If not improving with this, let us know for possible therapy versus imaging.

## 2013-07-12 NOTE — Progress Notes (Signed)
Pre-visit discussion using our clinic review tool. No additional management support is needed unless otherwise documented below in the visit note.  

## 2013-07-12 NOTE — Assessment & Plan Note (Signed)
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 Treat with naprosyn course and flexeril. Provided with stretching/strengthening exercises from [facility] pt advisor on Sacroiliac joint pain. Update if not improved- consider PT vs xrays.

## 2013-07-12 NOTE — Progress Notes (Signed)
   Subjective:    Patient ID: Jesse Rubio, male    DOB: 1985/07/01, 28 y.o.   MRN: [id number]  HPI CC: lower back pain  [address] down staircase as a 28 yo, on bottom down entire stairs.  Never evaluated for this.  Ongoing pain for last few years, getting worse.  Has tried ibuprofen for pain, ?tylenol.  Pain on right lower mid back along tailbone.  Has had knot present since injury. Denies shooting pain down leg.  No numbness or weakness of legs, fevers/chills, bowel/bladder accidents.  Smoking - wants to quit.  wellbutrin caused migraines. chantix caused vivid dreams.  History reviewed. No pertinent past medical history.  Past Surgical History  Procedure Laterality Date  . Vasectomy  2014   Review of Systems Per HPI     Objective:   Physical Exam  Nursing note and vitals reviewed. Constitutional: He appears well-developed and well-nourished. No distress.  Musculoskeletal: He exhibits no edema.  Midline spine tenderness entire lumbar region.  + lumbar paraspinous mm tenderness Neg SLR bilaterally Neg FABER Tender at R SIJ, nontender at sciatic notch       Assessment & Plan:

## 2013-07-13 ENCOUNTER — Telehealth: Payer: Self-pay | Admitting: Family Medicine

## 2013-07-13 NOTE — Telephone Encounter (Signed)
Relevant patient education assigned to patient using Emmi. ° °

## 2013-07-26 ENCOUNTER — Ambulatory Visit
Admission: RE | Admit: 2013-07-26 | Discharge: 2013-07-26 | Disposition: A | Discharge status: Home / Self Care | Payer: 59 | Source: Ambulatory Visit | Attending: Family Medicine | Admitting: Family Medicine

## 2013-07-26 ENCOUNTER — Telehealth: Payer: Self-pay

## 2013-07-26 DIAGNOSIS — M545 Low back pain, unspecified: Secondary | ICD-10-CM

## 2013-07-26 DIAGNOSIS — G8929 Other chronic pain: Secondary | ICD-10-CM

## 2013-07-26 NOTE — Telephone Encounter (Signed)
Jesse Rubio left v/m; pt seen 07/12/13 and was to call back for xrays if not improved with back problem. Jesse Rubio request xrays to be scheduled.Please advise.

## 2013-07-26 NOTE — Telephone Encounter (Signed)
xrays ordered

## 2013-07-26 NOTE — Telephone Encounter (Signed)
Patient's wife notified and he will come tomorrow for xray.

## 2013-07-27 ENCOUNTER — Ambulatory Visit (INDEPENDENT_AMBULATORY_CARE_PROVIDER_SITE_OTHER)
Admission: RE | Admit: 2013-07-27 | Discharge: 2013-07-27 | Disposition: A | Discharge status: Home / Self Care | Payer: 59 | Source: Ambulatory Visit | Attending: Family Medicine | Admitting: Family Medicine

## 2013-07-27 DIAGNOSIS — G8929 Other chronic pain: Secondary | ICD-10-CM

## 2013-07-27 DIAGNOSIS — M545 Low back pain, unspecified: Secondary | ICD-10-CM

## 2013-07-28 ENCOUNTER — Other Ambulatory Visit: Payer: Self-pay | Admitting: Family Medicine

## 2013-08-03 ENCOUNTER — Telehealth: Payer: Self-pay | Admitting: Family Medicine

## 2013-08-03 DIAGNOSIS — M545 Low back pain, unspecified: Secondary | ICD-10-CM

## 2013-08-03 DIAGNOSIS — G8929 Other chronic pain: Secondary | ICD-10-CM

## 2013-08-03 NOTE — Telephone Encounter (Signed)
Patients wife notified

## 2013-08-03 NOTE — Telephone Encounter (Signed)
Referral placed.  plz notify patient to expect a call from [place] in next week.

## 2013-08-03 NOTE — Telephone Encounter (Signed)
Pt wife called to request referral to back specialist. Pt wife stated at his visit that is everything was clear on x-rays we would refer him to a specialist. Pt looked on mychart and x-rays were normal. Please advise.

## 2013-08-21 ENCOUNTER — Other Ambulatory Visit: Payer: Self-pay | Admitting: Family Medicine

## 2013-08-22 NOTE — Telephone Encounter (Signed)
Ok to refill 

## 2013-10-16 ENCOUNTER — Encounter: Payer: Self-pay | Admitting: Family Medicine

## 2013-10-16 ENCOUNTER — Ambulatory Visit (INDEPENDENT_AMBULATORY_CARE_PROVIDER_SITE_OTHER): Payer: 59 | Admitting: Family Medicine

## 2013-10-16 VITALS — BP 128/82 | HR 76 | Temp 98.3°F | Wt 209.5 lb

## 2013-10-16 DIAGNOSIS — J029 Acute pharyngitis, unspecified: Secondary | ICD-10-CM | POA: Insufficient documentation

## 2013-10-16 LAB — POCT RAPID STREP A (OFFICE): Rapid Strep A Screen: NEGATIVE

## 2013-10-16 NOTE — Assessment & Plan Note (Signed)
Anticipate viral as no exudates, no LAD, no fever.  However marked tonsillitis noted today - will send strep culture today. Supportive care as per instructions.

## 2013-10-16 NOTE — Progress Notes (Signed)
Pre visit review using our clinic review tool, if applicable. No additional management support is needed unless otherwise documented below in the visit note. 

## 2013-10-16 NOTE — Progress Notes (Signed)
BP 128/82  Pulse 76  Temp(Src) 98.3 F (36.8 C) (Oral)  Wt 209 lb 8 oz (95.029 kg)   CC: ST  Subjective:    Patient ID: Jesse Rubio, male    DOB: July 05, 1985, 28 y.o.   MRN: [id number]  HPI: Jesse Rubio is a 28 y.o. male presenting on 10/16/2013 for Sore Throat   3d h/o ST.  Woke him up from sleep.  Focal on left side.  Some radiation up to chin.    No fevers/chills, headaches, coughing, ear or tooth pain, PNdrainage.  No congestion.  Tried OTC numbing spray which didn't help.  Numbing lozenges didn't help.  Also tried 800mg  ibuprofen but didn't help.  No recent abx use.   No h/o asthma, no h/o allergies. Tobacco - 1 ppd.  chantix caused vivid dreams.  wellbutrin caused migraines. No sick contacts.  Relevant past medical, surgical, family and social history reviewed and updated as indicated.  Allergies and medications reviewed and updated. Current Outpatient Prescriptions on File Prior to Visit  Medication Sig  . cyclobenzaprine (FLEXERIL) 10 MG tablet TAKE 1 TABLET BY MOUTH TWICE A DAY AS NEEDED  . naproxen (NAPROSYN) 500 MG tablet Take one po bid x 1 week then prn pain, take with food   No current facility-administered medications on file prior to visit.    Review of Systems Per HPI unless specifically indicated above    Objective:    BP 128/82  Pulse 76  Temp(Src) 98.3 F (36.8 C) (Oral)  Wt 209 lb 8 oz (95.029 kg)  Physical Exam  Nursing note and vitals reviewed. Constitutional: He appears well-developed and well-nourished. No distress.  HENT:  Head: Normocephalic and atraumatic.  Right Ear: Hearing, tympanic membrane, external ear and ear canal normal.  Left Ear: Hearing, tympanic membrane, external ear and ear canal normal.  Nose: Nose normal. No mucosal edema or rhinorrhea. Right sinus exhibits no maxillary sinus tenderness and no frontal sinus tenderness. Left sinus exhibits no maxillary sinus tenderness and no frontal sinus tenderness.    Mouth/Throat: Uvula is midline and mucous membranes are normal. Posterior oropharyngeal edema and posterior oropharyngeal erythema present. No oropharyngeal exudate or tonsillar abscesses.  R swollen erythematous tonsil  Eyes: Conjunctivae and EOM are normal. Pupils are equal, round, and reactive to light. No scleral icterus.  Neck: Normal range of motion. Neck supple. No thyromegaly present.  Cardiovascular: Normal rate, regular rhythm, normal heart sounds and intact distal pulses.   No murmur heard. Pulmonary/Chest: Effort normal and breath sounds normal. No respiratory distress. He has no wheezes. He has no rales.  Lymphadenopathy:    He has no cervical adenopathy.  Skin: Skin is warm and dry. No rash noted.   Results for orders placed in visit on 10/16/13  POCT RAPID STREP A (OFFICE)      Result Value Ref Range   Rapid Strep A Screen Negative  Negative      Assessment & Plan:   Problem List Items Addressed This Visit   Acute pharyngitis - Primary     Anticipate viral as no exudates, no LAD, no fever.  However marked tonsillitis noted today - will send strep culture today. Supportive care as per instructions.     Other Visit Diagnoses   Sore throat        Relevant Orders       POCT rapid strep A (Completed)        Follow up plan: Return if symptoms worsen or  fail to improve.

## 2013-10-16 NOTE — Patient Instructions (Signed)
You have pharyngitis but likely viral process.  Antibiotics are not needed for this. Push fluids and plenty of rest. May use ibuprofen 600-800mg  with food for throat inflammation. Salt water gargles. Suck on cold things like popsicles or warm things like herbal teas (whichever soothes the throat better). Work on quitting smoking. Let me know if fever >101 or not improving as expected. Good to see you today, call clinic with questions.

## 2013-10-16 NOTE — Addendum Note (Signed)
Addended by: Ellamae Sia on: 10/16/2013 04:51 PM   Modules accepted: Orders

## 2013-10-17 ENCOUNTER — Ambulatory Visit: Payer: 59 | Admitting: Family Medicine

## 2013-10-18 LAB — CULTURE, GROUP A STREP: Organism ID, Bacteria: NORMAL

## 2013-12-31 ENCOUNTER — Other Ambulatory Visit: Payer: Self-pay | Admitting: Orthopaedic Surgery

## 2013-12-31 DIAGNOSIS — M47816 Spondylosis without myelopathy or radiculopathy, lumbar region: Secondary | ICD-10-CM

## 2014-01-01 ENCOUNTER — Other Ambulatory Visit: Payer: Self-pay | Admitting: Orthopaedic Surgery

## 2014-01-01 DIAGNOSIS — M47816 Spondylosis without myelopathy or radiculopathy, lumbar region: Secondary | ICD-10-CM

## 2014-01-10 ENCOUNTER — Ambulatory Visit
Admission: RE | Admit: 2014-01-10 | Discharge: 2014-01-10 | Disposition: A | Discharge status: Home / Self Care | Payer: 59 | Source: Ambulatory Visit | Attending: Orthopaedic Surgery | Admitting: Orthopaedic Surgery

## 2014-01-10 DIAGNOSIS — M47816 Spondylosis without myelopathy or radiculopathy, lumbar region: Secondary | ICD-10-CM

## 2014-06-07 IMAGING — CR DG SACRUM/COCCYX 2+V
3 series · 3 of 3 positions shown · non-contrast
Comparison: Lumbar radiography 11/26/2007.

CLINICAL DATA: Sacroiliac joint pain on the right.

EXAM:
SACRUM AND COCCYX - 2+ VIEW

[view not recorded (1 of 3)]
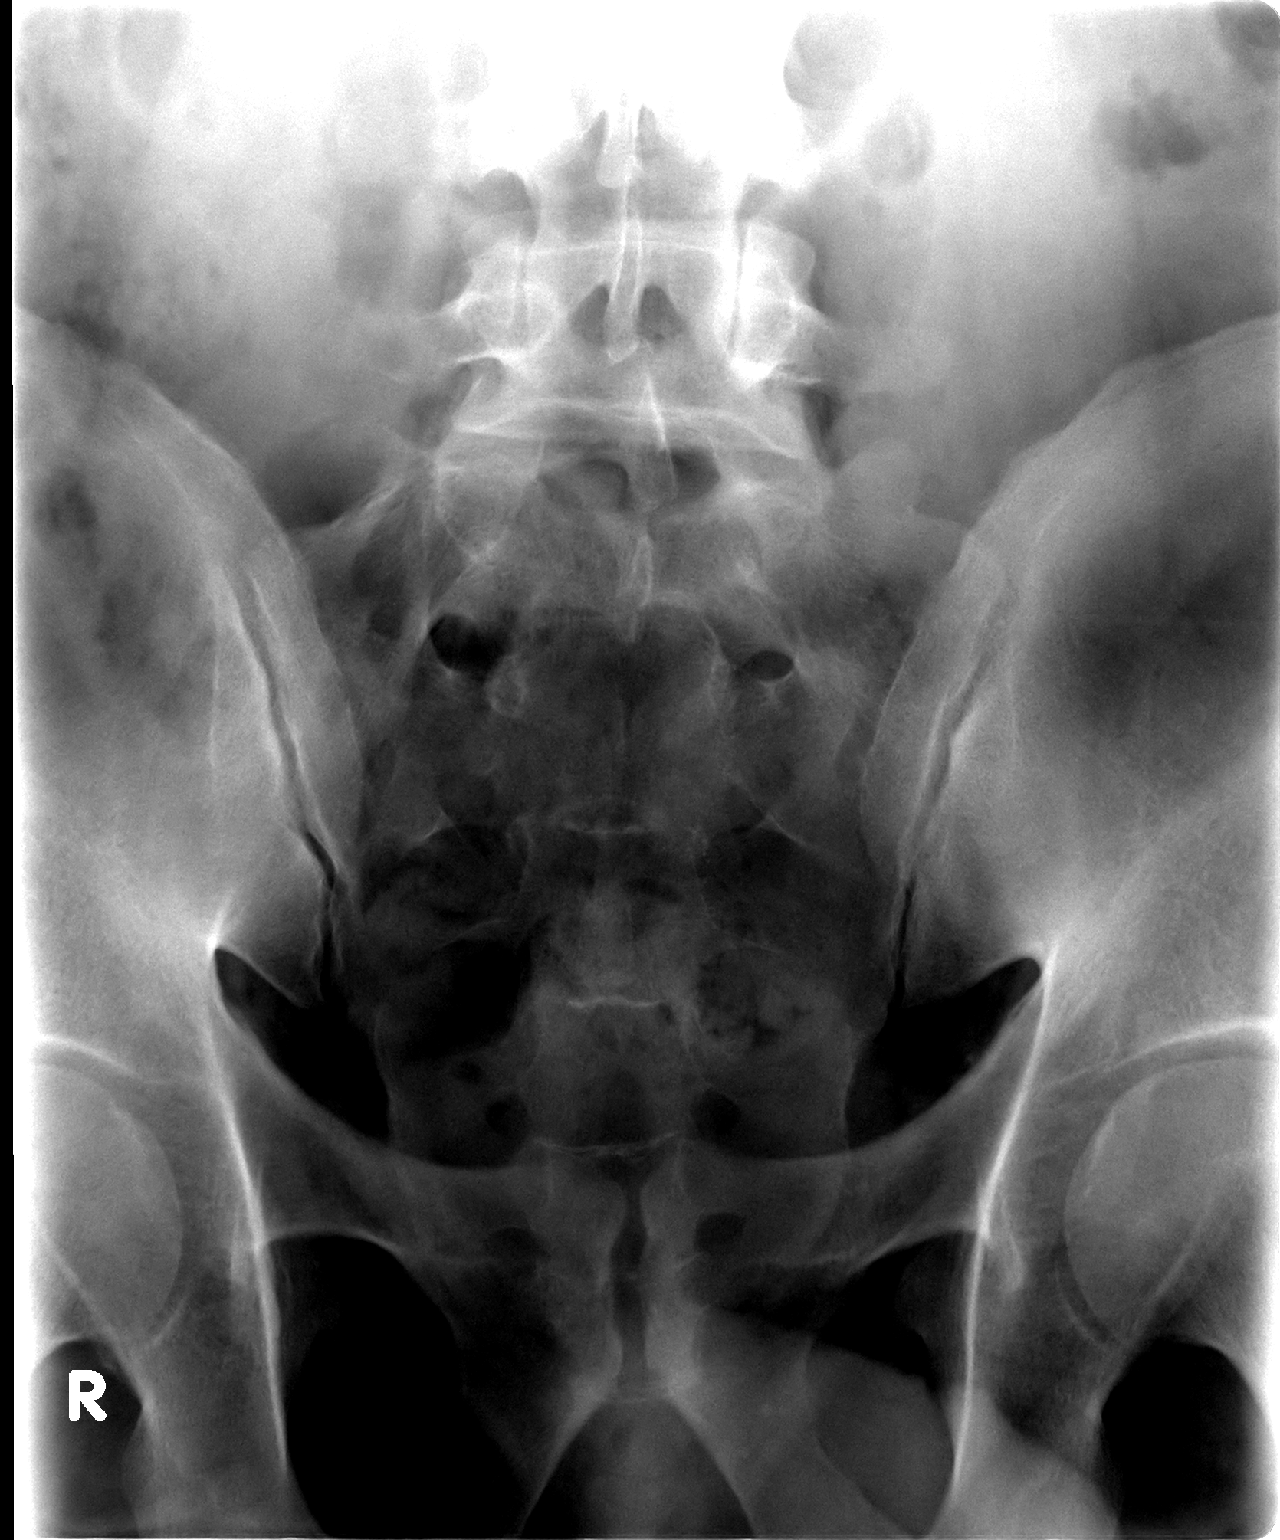

[view not recorded (2 of 3)]
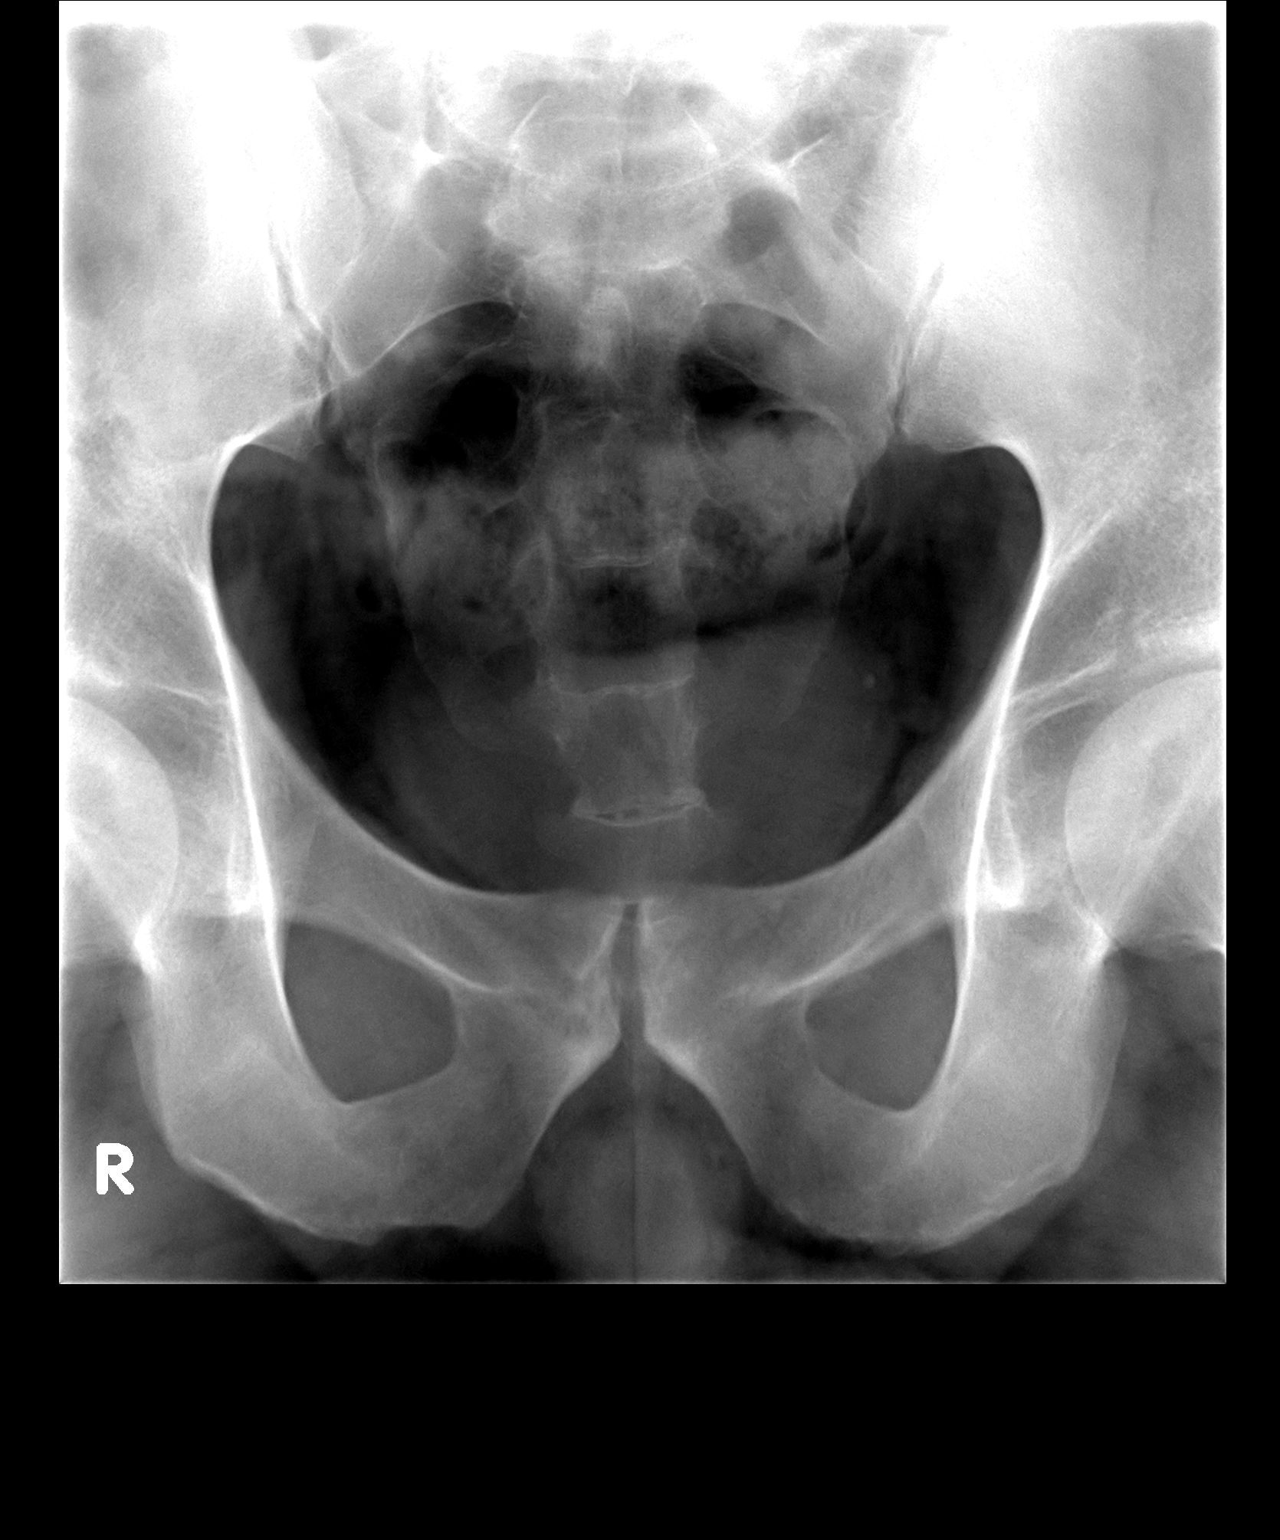

[view not recorded (3 of 3)]
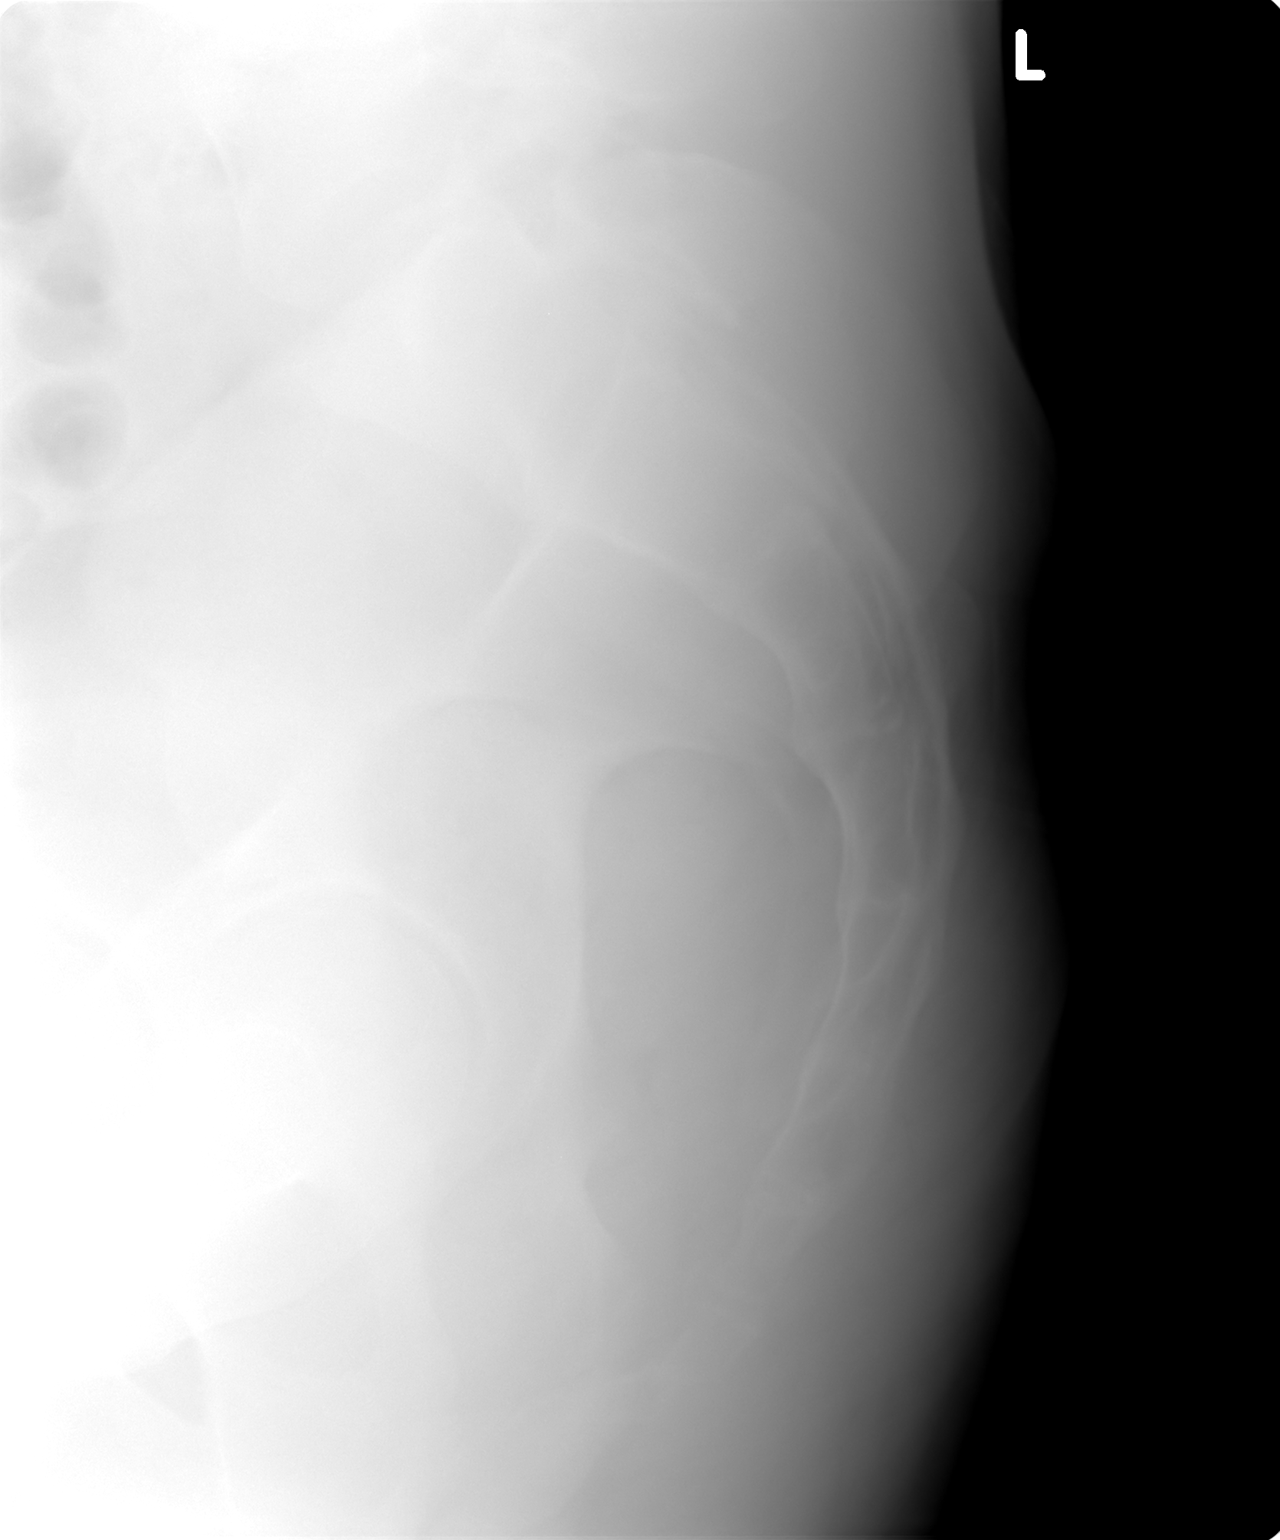

[3 of 3 positions shown; findings below may reference images not displayed]

FINDINGS: The sacrum itself appears normal without focal lesion. Sacroiliac
joints appear symmetrically normal. No evidence of erosive or
degenerative arthritis by plain radiography.
IMPRESSION: Normal radiographs.

## 2014-07-12 ENCOUNTER — Ambulatory Visit: Payer: Self-pay | Admitting: Internal Medicine

## 2014-12-06 ENCOUNTER — Telehealth: Payer: Self-pay

## 2014-12-06 NOTE — Telephone Encounter (Signed)
Mrs Sthilaire, pts wife left v/m; pt wants to stop smoking and request nicoderm CQ sent to [facility]. Pts ins does cover the nicoderm CQ. Mrs Uselman request cb. Discussed stop smoking at 10/16/13 visit. Does pt need to make appt to be seen?

## 2014-12-09 MED ORDER — NICOTINE 7 MG/24HR TD PT24: 7.0000 mg | MEDICATED_PATCH | Freq: Every day | TRANSDERMAL | Status: DC

## 2014-12-09 NOTE — Telephone Encounter (Signed)
No does not need to be seen. I happy to hear that he is ready to quit!eRx sent.

## 2014-12-09 NOTE — Telephone Encounter (Signed)
Spoke to pt and informed him OV not required

## 2015-05-23 ENCOUNTER — Ambulatory Visit (INDEPENDENT_AMBULATORY_CARE_PROVIDER_SITE_OTHER): Payer: Commercial Managed Care - HMO | Admitting: Family Medicine

## 2015-05-23 ENCOUNTER — Encounter: Payer: Self-pay | Admitting: Family Medicine

## 2015-05-23 VITALS — BP 110/78 | HR 81 | Temp 98.3°F | Ht 72.0 in | Wt 210.0 lb

## 2015-05-23 DIAGNOSIS — R059 Cough, unspecified: Secondary | ICD-10-CM | POA: Insufficient documentation

## 2015-05-23 DIAGNOSIS — R05 Cough: Secondary | ICD-10-CM | POA: Diagnosis not present

## 2015-05-23 MED ORDER — GUAIFENESIN-CODEINE 100-10 MG/5ML PO SYRP: 5.0000 mL | ORAL_SOLUTION | Freq: Every evening | ORAL | Status: DC | PRN

## 2015-05-23 MED ORDER — AZITHROMYCIN 250 MG PO TABS: ORAL_TABLET | ORAL | Status: DC

## 2015-05-23 NOTE — Assessment & Plan Note (Signed)
Likely viral infection, but pt is smoker. Treat with symptomatic care, rest, fluids. If not improving as expected start antibiotics.

## 2015-05-23 NOTE — Progress Notes (Signed)
   Subjective:    Patient ID: Jesse Rubio, male    DOB: 14-Apr-1986, 29 y.o.   MRN: [id number]  Cough This is a new problem. The current episode started in the past 7 days. The problem has been gradually worsening. The problem occurs constantly. The cough is productive of sputum. Associated symptoms include chills, nasal congestion, postnasal drip, rhinorrhea, a sore throat and wheezing. Pertinent negatives include no ear congestion, ear pain, fever, headaches, hemoptysis, rash or shortness of breath. The symptoms are aggravated by lying down (cougha dn wheezing at night). Risk factors for lung disease include smoking/tobacco exposure. Treatments tried: dayquil. The treatment provided no relief. His past medical history is significant for bronchitis. There is no history of asthma, bronchiectasis, COPD, environmental allergies or pneumonia.    Social History /Family History/Past Medical History reviewed and updated if needed.    Review of Systems  Constitutional: Positive for chills. Negative for fever.  HENT: Positive for postnasal drip, rhinorrhea and sore throat. Negative for ear pain.   Respiratory: Positive for cough and wheezing. Negative for hemoptysis and shortness of breath.   Skin: Negative for rash.  Allergic/Immunologic: Negative for environmental allergies.  Neurological: Negative for headaches.       Objective:   Physical Exam  Constitutional: Vital signs are normal. He appears well-developed and well-nourished.  Non-toxic appearance. He does not appear ill. No distress.  HENT:  Head: Normocephalic and atraumatic.  Right Ear: Hearing, tympanic membrane, external ear and ear canal normal. No tenderness. No foreign bodies. Tympanic membrane is not retracted and not bulging.  Left Ear: Hearing, tympanic membrane, external ear and ear canal normal. No tenderness. No foreign bodies. Tympanic membrane is not retracted and not bulging.  Nose: Nose normal. No mucosal edema or  rhinorrhea. Right sinus exhibits no maxillary sinus tenderness and no frontal sinus tenderness. Left sinus exhibits no maxillary sinus tenderness and no frontal sinus tenderness.  Mouth/Throat: Uvula is midline, oropharynx is clear and moist and mucous membranes are normal. Normal dentition. No dental caries. No oropharyngeal exudate or tonsillar abscesses.  Eyes: Conjunctivae, EOM and lids are normal. Pupils are equal, round, and reactive to light. Lids are everted and swept, no foreign bodies found.  Neck: Trachea normal, normal range of motion and phonation normal. Neck supple. Carotid bruit is not present. No thyroid mass and no thyromegaly present.  Cardiovascular: Normal rate, regular rhythm, S1 normal, S2 normal, normal heart sounds, intact distal pulses and normal pulses.  Exam reveals no gallop.   No murmur heard. Pulmonary/Chest: Effort normal and breath sounds normal. No respiratory distress. He has no wheezes. He has no rhonchi. He has no rales.  Abdominal: Soft. Normal appearance and bowel sounds are normal. There is no hepatosplenomegaly. There is no tenderness. There is no rebound, no guarding and no CVA tenderness. No hernia.  Neurological: He is alert. He has normal reflexes.  Skin: Skin is warm, dry and intact. No rash noted.  Psychiatric: He has a normal mood and affect. His speech is normal and behavior is normal. Judgment normal.          Assessment & Plan:

## 2015-05-23 NOTE — Progress Notes (Signed)
Pre visit review using our clinic review tool, if applicable. No additional management support is needed unless otherwise documented below in the visit note. 

## 2015-05-23 NOTE — Patient Instructions (Addendum)
Quit smoking! Mucinex DM during the day. Prescription cough suppressant at night. Push fluids and rest.  If not improving in  The next 3-4 days, compelte antibitoics... Z-pak.

## 2015-05-30 ENCOUNTER — Encounter: Payer: Self-pay | Admitting: Family Medicine

## 2015-05-30 MED ORDER — AZITHROMYCIN 250 MG PO TABS: ORAL_TABLET | ORAL | Status: DC

## 2015-05-30 MED ORDER — HYDROCODONE-HOMATROPINE 5-1.5 MG/5ML PO SYRP: 5.0000 mL | ORAL_SOLUTION | Freq: Every evening | ORAL | Status: DC | PRN

## 2015-05-30 NOTE — Telephone Encounter (Signed)
Fax in cough suppressant please.

## 2015-05-30 NOTE — Telephone Encounter (Signed)
You can't fax or call in Hycodan due to the hydrocodone.  Patient will have to pick up prescription.

## 2015-06-27 ENCOUNTER — Telehealth: Payer: Self-pay

## 2015-06-27 ENCOUNTER — Other Ambulatory Visit: Payer: Self-pay | Admitting: Internal Medicine

## 2015-06-27 MED ORDER — ALBENDAZOLE 200 MG PO TABS: ORAL_TABLET | ORAL | Status: DC

## 2015-06-27 NOTE — Telephone Encounter (Signed)
albenza sent to pharmacy

## 2015-06-27 NOTE — Telephone Encounter (Signed)
Called but mailbox was full unable to lmovm

## 2015-06-27 NOTE — Telephone Encounter (Signed)
Pt's wife left v/m; pts son was dx with pinworms by pediatrician and was treated with Albenza and pt request rx for Albenza to [facility] [address]. Avie Echevaria NP said if no symptoms could try Ayesha Rumpf' pinworm med suspension.  tried Reese[facility] and it did not work and pt request Albenza sent to pharmacy. Pt is having rectal itching.

## 2015-08-27 ENCOUNTER — Emergency Department (HOSPITAL_COMMUNITY): Payer: Managed Care, Other (non HMO)

## 2015-08-27 ENCOUNTER — Emergency Department (HOSPITAL_COMMUNITY)
Admission: EM | Admit: 2015-08-27 | Discharge: 2015-08-28 | Disposition: A | Discharge status: Home / Self Care | Payer: Managed Care, Other (non HMO) | Attending: Emergency Medicine | Admitting: Emergency Medicine

## 2015-08-27 ENCOUNTER — Encounter (HOSPITAL_COMMUNITY): Payer: Self-pay | Admitting: Emergency Medicine

## 2015-08-27 DIAGNOSIS — S8991XA Unspecified injury of right lower leg, initial encounter: Secondary | ICD-10-CM | POA: Insufficient documentation

## 2015-08-27 DIAGNOSIS — Y9389 Activity, other specified: Secondary | ICD-10-CM | POA: Insufficient documentation

## 2015-08-27 DIAGNOSIS — F17210 Nicotine dependence, cigarettes, uncomplicated: Secondary | ICD-10-CM | POA: Diagnosis not present

## 2015-08-27 DIAGNOSIS — Y998 Other external cause status: Secondary | ICD-10-CM | POA: Diagnosis not present

## 2015-08-27 DIAGNOSIS — Z23 Encounter for immunization: Secondary | ICD-10-CM | POA: Diagnosis not present

## 2015-08-27 DIAGNOSIS — S82001A Unspecified fracture of right patella, initial encounter for closed fracture: Secondary | ICD-10-CM

## 2015-08-27 DIAGNOSIS — S82041A Displaced comminuted fracture of right patella, initial encounter for closed fracture: Secondary | ICD-10-CM | POA: Diagnosis not present

## 2015-08-27 DIAGNOSIS — Y92410 Unspecified street and highway as the place of occurrence of the external cause: Secondary | ICD-10-CM | POA: Insufficient documentation

## 2015-08-27 DIAGNOSIS — S92241A Displaced fracture of medial cuneiform of right foot, initial encounter for closed fracture: Secondary | ICD-10-CM

## 2015-08-27 DIAGNOSIS — S02402A Zygomatic fracture, unspecified, initial encounter for closed fracture: Secondary | ICD-10-CM | POA: Insufficient documentation

## 2015-08-27 DIAGNOSIS — M79604 Pain in right leg: Secondary | ICD-10-CM

## 2015-08-27 LAB — COMPREHENSIVE METABOLIC PANEL
ALT: 31 U/L (ref 17–63)
AST: 27 U/L (ref 15–41)
Albumin: 3.8 g/dL (ref 3.5–5.0)
Alkaline Phosphatase: 67 U/L (ref 38–126)
Anion gap: 12 (ref 5–15)
BUN: 6 mg/dL (ref 6–20)
CHLORIDE: 103 mmol/L (ref 101–111)
CO2: 25 mmol/L (ref 22–32)
Calcium: 9.1 mg/dL (ref 8.9–10.3)
Creatinine, Ser: 0.81 mg/dL (ref 0.61–1.24)
Glucose, Bld: 101 mg/dL — ABNORMAL HIGH (ref 65–99)
POTASSIUM: 3.5 mmol/L (ref 3.5–5.1)
SODIUM: 140 mmol/L (ref 135–145)
Total Bilirubin: 0.3 mg/dL (ref 0.3–1.2)
Total Protein: 6.9 g/dL (ref 6.5–8.1)

## 2015-08-27 LAB — CBC
HCT: 42.2 % (ref 39.0–52.0)
Hemoglobin: 14.4 g/dL (ref 13.0–17.0)
MCH: 31.2 pg (ref 26.0–34.0)
MCHC: 34.1 g/dL (ref 30.0–36.0)
MCV: 91.3 fL (ref 78.0–100.0)
PLATELETS: 386 10*3/uL (ref 150–400)
RBC: 4.62 MIL/uL (ref 4.22–5.81)
RDW: 12.8 % (ref 11.5–15.5)
WBC: 19.3 10*3/uL — AB (ref 4.0–10.5)

## 2015-08-27 LAB — LIPASE, BLOOD: LIPASE: 29 U/L (ref 11–51)

## 2015-08-27 MED ORDER — TETANUS-DIPHTH-ACELL PERTUSSIS 5-2.5-18.5 LF-MCG/0.5 IM SUSP
0.5000 mL | Freq: Once | INTRAMUSCULAR | Status: AC
  Administered 2015-08-27: 0.5 mL via INTRAMUSCULAR
  Filled 2015-08-27: qty 0.5

## 2015-08-27 MED ORDER — ONDANSETRON HCL 4 MG/2ML IJ SOLN
4.0000 mg | Freq: Once | INTRAMUSCULAR | Status: AC
Start: 2015-08-27 — End: 2015-08-27
  Administered 2015-08-27: 4 mg via INTRAVENOUS
  Filled 2015-08-27: qty 2

## 2015-08-27 MED ORDER — HYDROMORPHONE HCL 1 MG/ML IJ SOLN
1.0000 mg | Freq: Once | INTRAMUSCULAR | Status: AC
  Administered 2015-08-28: 1 mg via INTRAVENOUS
  Filled 2015-08-27: qty 1

## 2015-08-27 MED ORDER — HYDROMORPHONE HCL 1 MG/ML IJ SOLN
1.0000 mg | Freq: Once | INTRAMUSCULAR | Status: AC
  Administered 2015-08-27: 1 mg via INTRAVENOUS
  Filled 2015-08-27: qty 1

## 2015-08-27 MED ORDER — IOHEXOL 300 MG/ML  SOLN: 100.0000 mL | Freq: Once | INTRAMUSCULAR | Status: DC | PRN

## 2015-08-27 MED ORDER — FENTANYL CITRATE (PF) 100 MCG/2ML IJ SOLN
50.0000 ug | Freq: Once | INTRAMUSCULAR | Status: AC
  Administered 2015-08-27: 50 ug via INTRAVENOUS
  Filled 2015-08-27: qty 2
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
## 2015-08-28 DIAGNOSIS — S8991XA Unspecified injury of right lower leg, initial encounter: Secondary | ICD-10-CM | POA: Diagnosis not present

## 2015-08-28 MED ORDER — HYDROCODONE-ACETAMINOPHEN 5-325 MG PO TABS: 1.0000 | ORAL_TABLET | ORAL | Status: DC | PRN

## 2015-08-28 MED ORDER — CYCLOBENZAPRINE HCL 10 MG PO TABS: 10.0000 mg | ORAL_TABLET | Freq: Two times a day (BID) | ORAL | Status: DC | PRN

## 2015-08-28 MED ORDER — HYDROCODONE-ACETAMINOPHEN 5-325 MG PO TABS
1.0000 | ORAL_TABLET | Freq: Once | ORAL | Status: AC
  Administered 2015-08-28: 1 via ORAL
  Filled 2015-08-28: qty 1

## 2015-08-28 NOTE — Discharge Instructions (Signed)
Please follow up with orthopedic surgery as well as otolaryngology as listed in the discharge instructions. Follow-up appointment should be made for less than one week from today. In the meantime, you should wear your knee immobilizer as well as ankle splint at all times. Please avoid all weightbearing to the right lower extremity. Use crutches for walking. Avoid blowing her nose until follow-up with otolaryngology in their clinic.

## 2015-08-28 NOTE — ED Notes (Signed)
Ortho tech at bedside 

## 2015-08-28 NOTE — Progress Notes (Signed)
Orthopedic Tech Progress Note Patient Details:  Jesse Rubio 02-24-1986 [id number]  Ortho Devices Type of Ortho Device: Crutches, Ankle splint Ortho Device/Splint Location: rle fiberglass splint Ortho Device/Splint Interventions: Ordered, Application   Karolee Stamps 08/28/2015, 1:41 AM

## 2015-08-29 ENCOUNTER — Ambulatory Visit: Payer: Self-pay | Admitting: Primary Care

## 2015-08-29 ENCOUNTER — Ambulatory Visit: Payer: Self-pay | Admitting: Orthopedic Surgery

## 2015-08-29 ENCOUNTER — Encounter (HOSPITAL_COMMUNITY): Payer: Self-pay | Admitting: *Deleted

## 2015-08-29 NOTE — Progress Notes (Signed)
Pt denies cardiac history, chest pain or sob. 

## 2015-08-31 DIAGNOSIS — S02402A Zygomatic fracture, unspecified, initial encounter for closed fracture: Secondary | ICD-10-CM | POA: Insufficient documentation

## 2015-09-01 ENCOUNTER — Encounter (HOSPITAL_COMMUNITY)
Admission: RE | Disposition: A | Discharge status: Home / Self Care | Payer: Self-pay | Source: Ambulatory Visit | Attending: Orthopedic Surgery

## 2015-09-01 ENCOUNTER — Ambulatory Visit (HOSPITAL_COMMUNITY): Payer: Managed Care, Other (non HMO) | Admitting: Critical Care Medicine

## 2015-09-01 ENCOUNTER — Ambulatory Visit (HOSPITAL_COMMUNITY): Payer: Managed Care, Other (non HMO)

## 2015-09-01 ENCOUNTER — Encounter (HOSPITAL_COMMUNITY): Payer: Self-pay | Admitting: Critical Care Medicine

## 2015-09-01 ENCOUNTER — Ambulatory Visit (HOSPITAL_COMMUNITY)
Admission: RE | Admit: 2015-09-01 | Discharge: 2015-09-01 | Disposition: A | Discharge status: Home / Self Care | Payer: Managed Care, Other (non HMO) | Source: Ambulatory Visit | Attending: Orthopedic Surgery | Admitting: Orthopedic Surgery

## 2015-09-01 DIAGNOSIS — S82041A Displaced comminuted fracture of right patella, initial encounter for closed fracture: Secondary | ICD-10-CM | POA: Insufficient documentation

## 2015-09-01 DIAGNOSIS — Z419 Encounter for procedure for purposes other than remedying health state, unspecified: Secondary | ICD-10-CM

## 2015-09-01 DIAGNOSIS — F17210 Nicotine dependence, cigarettes, uncomplicated: Secondary | ICD-10-CM | POA: Insufficient documentation

## 2015-09-01 HISTORY — PX: ORIF PATELLA: SHX5033

## 2015-09-01 HISTORY — DX: Restless legs syndrome: G25.81

## 2015-09-01 LAB — CBC
HEMATOCRIT: 45.2 % (ref 39.0–52.0)
Hemoglobin: 15.5 g/dL (ref 13.0–17.0)
MCH: 31.6 pg (ref 26.0–34.0)
MCHC: 34.3 g/dL (ref 30.0–36.0)
MCV: 92.2 fL (ref 78.0–100.0)
Platelets: 408 10*3/uL — ABNORMAL HIGH (ref 150–400)
RBC: 4.9 MIL/uL (ref 4.22–5.81)
RDW: 12.6 % (ref 11.5–15.5)
WBC: 12.4 10*3/uL — AB (ref 4.0–10.5)

## 2015-09-01 SURGERY — OPEN REDUCTION INTERNAL FIXATION (ORIF) PATELLA
Anesthesia: General | Laterality: Right

## 2015-09-01 MED ORDER — CEFAZOLIN SODIUM-DEXTROSE 2-3 GM-% IV SOLR
2.0000 g | INTRAVENOUS | Status: AC
  Administered 2015-09-01: 2 g via INTRAVENOUS

## 2015-09-01 MED ORDER — FENTANYL CITRATE (PF) 250 MCG/5ML IJ SOLN
INTRAMUSCULAR | Status: AC
  Filled 2015-09-01: qty 5

## 2015-09-01 MED ORDER — NEOSTIGMINE METHYLSULFATE 10 MG/10ML IV SOLN
INTRAVENOUS | Status: DC | PRN
Start: 2015-09-01 — End: 2015-09-01
  Administered 2015-09-01: 2 mg via INTRAVENOUS

## 2015-09-01 MED ORDER — CEFAZOLIN SODIUM-DEXTROSE 2-3 GM-% IV SOLR
INTRAVENOUS | Status: AC
  Filled 2015-09-01: qty 50

## 2015-09-01 MED ORDER — HYDROMORPHONE HCL 1 MG/ML IJ SOLN: 0.5000 mg | INTRAMUSCULAR | Status: DC | PRN

## 2015-09-01 MED ORDER — DOCUSATE SODIUM 100 MG PO CAPS: 100.0000 mg | ORAL_CAPSULE | Freq: Two times a day (BID) | ORAL | Status: DC

## 2015-09-01 MED ORDER — FENTANYL CITRATE (PF) 100 MCG/2ML IJ SOLN
25.0000 ug | INTRAMUSCULAR | Status: DC | PRN
  Administered 2015-09-01: 50 ug via INTRAVENOUS

## 2015-09-01 MED ORDER — OXYCODONE-ACETAMINOPHEN 5-325 MG PO TABS
1.0000 | ORAL_TABLET | ORAL | Status: DC | PRN
  Administered 2015-09-01: 2 via ORAL

## 2015-09-01 MED ORDER — ONDANSETRON HCL 4 MG PO TABS: 4.0000 mg | ORAL_TABLET | Freq: Four times a day (QID) | ORAL | Status: DC | PRN

## 2015-09-01 MED ORDER — ONDANSETRON HCL 4 MG PO TABS: 4.0000 mg | ORAL_TABLET | Freq: Three times a day (TID) | ORAL | Status: DC | PRN

## 2015-09-01 MED ORDER — MIDAZOLAM HCL 2 MG/2ML IJ SOLN
INTRAMUSCULAR | Status: AC
  Filled 2015-09-01: qty 2

## 2015-09-01 MED ORDER — KETOROLAC TROMETHAMINE 30 MG/ML IJ SOLN
INTRAMUSCULAR | Status: AC
  Filled 2015-09-01: qty 1

## 2015-09-01 MED ORDER — KETOROLAC TROMETHAMINE 30 MG/ML IJ SOLN
INTRAMUSCULAR | Status: DC | PRN
Start: 2015-09-01 — End: 2015-09-01
  Administered 2015-09-01: 30 mg via INTRAVENOUS

## 2015-09-01 MED ORDER — CHLORHEXIDINE GLUCONATE 4 % EX LIQD: 60.0000 mL | Freq: Once | CUTANEOUS | Status: DC

## 2015-09-01 MED ORDER — METHOCARBAMOL 500 MG PO TABS
500.0000 mg | ORAL_TABLET | Freq: Four times a day (QID) | ORAL | Status: DC | PRN
  Filled 2015-09-01: qty 1

## 2015-09-01 MED ORDER — SODIUM CHLORIDE 0.9 % IV SOLN
INTRAVENOUS | Status: DC
Start: 2015-09-01 — End: 2015-09-01

## 2015-09-01 MED ORDER — METOCLOPRAMIDE HCL 5 MG PO TABS: 5.0000 mg | ORAL_TABLET | Freq: Three times a day (TID) | ORAL | Status: DC | PRN

## 2015-09-01 MED ORDER — OXYCODONE-ACETAMINOPHEN 5-325 MG PO TABS: 1.0000 | ORAL_TABLET | ORAL | Status: DC | PRN

## 2015-09-01 MED ORDER — ACETAMINOPHEN 10 MG/ML IV SOLN
INTRAVENOUS | Status: DC | PRN
  Administered 2015-09-01: 1000 mg via INTRAVENOUS

## 2015-09-01 MED ORDER — ASPIRIN EC 325 MG PO TBEC: 325.0000 mg | DELAYED_RELEASE_TABLET | Freq: Two times a day (BID) | ORAL | Status: DC

## 2015-09-01 MED ORDER — ONDANSETRON HCL 4 MG/2ML IJ SOLN: 4.0000 mg | Freq: Four times a day (QID) | INTRAMUSCULAR | Status: DC | PRN

## 2015-09-01 MED ORDER — ROCURONIUM BROMIDE 100 MG/10ML IV SOLN
INTRAVENOUS | Status: DC | PRN
  Administered 2015-09-01: 20 mg via INTRAVENOUS

## 2015-09-01 MED ORDER — GLYCOPYRROLATE 0.2 MG/ML IJ SOLN
INTRAMUSCULAR | Status: DC | PRN
  Administered 2015-09-01: 0.3 mg via INTRAVENOUS

## 2015-09-01 MED ORDER — METOCLOPRAMIDE HCL 5 MG/ML IJ SOLN: 5.0000 mg | Freq: Three times a day (TID) | INTRAMUSCULAR | Status: DC | PRN

## 2015-09-01 MED ORDER — LIDOCAINE HCL (CARDIAC) 20 MG/ML IV SOLN
INTRAVENOUS | Status: DC | PRN
  Administered 2015-09-01: 100 mg via INTRAVENOUS

## 2015-09-01 MED ORDER — PROPOFOL 10 MG/ML IV BOLUS
INTRAVENOUS | Status: DC | PRN
  Administered 2015-09-01: 200 mg via INTRAVENOUS

## 2015-09-01 MED ORDER — FENTANYL CITRATE (PF) 100 MCG/2ML IJ SOLN
INTRAMUSCULAR | Status: DC | PRN
  Administered 2015-09-01 (×3): 50 ug via INTRAVENOUS
  Administered 2015-09-01 (×2): 25 ug via INTRAVENOUS
  Administered 2015-09-01: 150 ug via INTRAVENOUS
  Administered 2015-09-01 (×4): 25 ug via INTRAVENOUS
  Administered 2015-09-01: 50 ug via INTRAVENOUS
  Administered 2015-09-01 (×5): 25 ug via INTRAVENOUS
  Administered 2015-09-01: 50 ug via INTRAVENOUS
  Administered 2015-09-01: 25 ug via INTRAVENOUS

## 2015-09-01 MED ORDER — FENTANYL CITRATE (PF) 100 MCG/2ML IJ SOLN
INTRAMUSCULAR | Status: AC
  Filled 2015-09-01: qty 2

## 2015-09-01 MED ORDER — ACETAMINOPHEN 10 MG/ML IV SOLN
INTRAVENOUS | Status: AC
  Filled 2015-09-01: qty 100

## 2015-09-01 MED ORDER — DEXAMETHASONE SODIUM PHOSPHATE 4 MG/ML IJ SOLN
INTRAMUSCULAR | Status: AC
  Filled 2015-09-01: qty 1

## 2015-09-01 MED ORDER — MIDAZOLAM HCL 5 MG/5ML IJ SOLN
INTRAMUSCULAR | Status: DC | PRN
  Administered 2015-09-01: 2 mg via INTRAVENOUS

## 2015-09-01 MED ORDER — LACTATED RINGERS IV SOLN
INTRAVENOUS | Status: DC
Start: 2015-09-01 — End: 2015-09-01
  Administered 2015-09-01 (×2): via INTRAVENOUS

## 2015-09-01 MED ORDER — ONDANSETRON HCL 4 MG/2ML IJ SOLN
INTRAMUSCULAR | Status: DC | PRN
  Administered 2015-09-01: 4 mg via INTRAVENOUS

## 2015-09-01 MED ORDER — PROMETHAZINE HCL 25 MG/ML IJ SOLN: 6.2500 mg | INTRAMUSCULAR | Status: DC | PRN

## 2015-09-01 MED ORDER — OXYCODONE-ACETAMINOPHEN 5-325 MG PO TABS
ORAL_TABLET | ORAL | Status: AC
  Filled 2015-09-01: qty 2

## 2015-09-01 MED ORDER — DEXAMETHASONE SODIUM PHOSPHATE 4 MG/ML IJ SOLN
INTRAMUSCULAR | Status: DC | PRN
  Administered 2015-09-01: 8 mg via INTRAVENOUS

## 2015-09-01 MED ORDER — BUPIVACAINE-EPINEPHRINE (PF) 0.5% -1:200000 IJ SOLN
INTRAMUSCULAR | Status: DC | PRN
  Administered 2015-09-01: 30 mL via PERINEURAL

## 2015-09-01 MED ORDER — 0.9 % SODIUM CHLORIDE (POUR BTL) OPTIME
TOPICAL | Status: DC | PRN
  Administered 2015-09-01 (×2): 1000 mL

## 2015-09-01 MED ORDER — SENNA 8.6 MG PO TABS: 2.0000 | ORAL_TABLET | Freq: Every day | ORAL | Status: DC

## 2015-09-01 MED ORDER — DEXTROSE 5 % IV SOLN
500.0000 mg | Freq: Four times a day (QID) | INTRAVENOUS | Status: DC | PRN
  Filled 2015-09-01: qty 5

## 2015-09-01 SURGICAL SUPPLY — 47 items
ALCOHOL 70% 16 OZ (MISCELLANEOUS) ×3 IMPLANT
BANDAGE ELASTIC 4 VELCRO ST LF (GAUZE/BANDAGES/DRESSINGS) ×3 IMPLANT
BANDAGE ELASTIC 6 VELCRO ST LF (GAUZE/BANDAGES/DRESSINGS) ×3 IMPLANT
CHLORAPREP W/TINT 26ML (MISCELLANEOUS) ×3 IMPLANT
CUFF TOURNIQUET SINGLE 34IN LL (TOURNIQUET CUFF) ×3 IMPLANT
CUFF TOURNIQUET SINGLE 44IN (TOURNIQUET CUFF) IMPLANT
DERMABOND ADVANCED (GAUZE/BANDAGES/DRESSINGS) ×2
DERMABOND ADVANCED .7 DNX12 (GAUZE/BANDAGES/DRESSINGS) ×1 IMPLANT
DRAPE C-ARMOR (DRAPES) ×3 IMPLANT
DRAPE U-SHAPE 47X51 STRL (DRAPES) ×3 IMPLANT
DRSG AQUACEL AG ADV 3.5X10 (GAUZE/BANDAGES/DRESSINGS) ×3 IMPLANT
DRSG PAD ABDOMINAL 8X10 ST (GAUZE/BANDAGES/DRESSINGS) IMPLANT
ELECT REM PT RETURN 9FT ADLT (ELECTROSURGICAL) ×3
ELECTRODE REM PT RTRN 9FT ADLT (ELECTROSURGICAL) ×1 IMPLANT
GLOVE BIOGEL PI IND STRL 8.5 (GLOVE) ×1 IMPLANT
GLOVE BIOGEL PI INDICATOR 8.5 (GLOVE) ×2
GLOVE SURG ORTHO 8.5 STRL (GLOVE) ×6 IMPLANT
GOWN STRL REUS W/ TWL LRG LVL3 (GOWN DISPOSABLE) ×1 IMPLANT
GOWN STRL REUS W/TWL 2XL LVL3 (GOWN DISPOSABLE) ×3 IMPLANT
GOWN STRL REUS W/TWL LRG LVL3 (GOWN DISPOSABLE) ×2
KIT BASIN OR (CUSTOM PROCEDURE TRAY) ×3 IMPLANT
KIT ROOM TURNOVER OR (KITS) ×3 IMPLANT
NS IRRIG 1000ML POUR BTL (IV SOLUTION) ×3 IMPLANT
PACK ORTHO EXTREMITY (CUSTOM PROCEDURE TRAY) ×3 IMPLANT
PAD ARMBOARD 7.5X6 YLW CONV (MISCELLANEOUS) ×6 IMPLANT
RETRIEVER SUT HEWSON (MISCELLANEOUS) IMPLANT
SPONGE LAP 18X18 X RAY DECT (DISPOSABLE) ×3 IMPLANT
SPONGE LAP 4X18 X RAY DECT (DISPOSABLE) ×3 IMPLANT
STAPLER VISISTAT 35W (STAPLE) IMPLANT
STOCKINETTE IMPERVIOUS 9X36 MD (GAUZE/BANDAGES/DRESSINGS) ×3 IMPLANT
SUT FIBERWIRE #2 38 T-5 BLUE (SUTURE) ×3
SUT MNCRL AB 3-0 PS2 18 (SUTURE) ×3 IMPLANT
SUT MON AB 2-0 CT1 36 (SUTURE) ×6 IMPLANT
SUT VIC AB 0 CT1 27 (SUTURE) ×10
SUT VIC AB 0 CT1 27XBRD ANBCTR (SUTURE) ×5 IMPLANT
SUT VIC AB 1 CT1 27 (SUTURE) ×4
SUT VIC AB 1 CT1 27XBRD ANBCTR (SUTURE) ×2 IMPLANT
SUT VIC AB 2-0 CT1 27 (SUTURE) ×8
SUT VIC AB 2-0 CT1 27XBRD (SUTURE) ×3 IMPLANT
SUT VIC AB 2-0 CT1 TAPERPNT 27 (SUTURE) ×1 IMPLANT
SUTURE FIBERWR #2 38 T-5 BLUE (SUTURE) ×1 IMPLANT
TOWEL OR 17X24 6PK STRL BLUE (TOWEL DISPOSABLE) ×3 IMPLANT
TOWEL OR 17X26 10 PK STRL BLUE (TOWEL DISPOSABLE) IMPLANT
TUBE CONNECTING 12'X1/4 (SUCTIONS) ×1
TUBE CONNECTING 12X1/4 (SUCTIONS) ×2 IMPLANT
WATER STERILE IRR 1000ML POUR (IV SOLUTION) IMPLANT
YANKAUER SUCT BULB TIP NO VENT (SUCTIONS) ×3 IMPLANT

## 2015-09-01 NOTE — Anesthesia Postprocedure Evaluation (Signed)
Anesthesia Post Note  Patient: Jesse Rubio  Procedure(s) Performed: Procedure(s) (LRB): OPEN REDUCTION INTERNAL (ORIF) FIXATION RIGHT PATELLA (Right)  Patient location during evaluation: PACU Anesthesia Type: General and Regional Level of consciousness: awake and alert Pain management: pain level controlled Vital Signs Assessment: post-procedure vital signs reviewed and stable Respiratory status: spontaneous breathing, nonlabored ventilation, respiratory function stable and patient connected to nasal cannula oxygen Cardiovascular status: blood pressure returned to baseline and stable Postop Assessment: no signs of nausea or vomiting Anesthetic complications: no Comments: General anesthesia with post-op femoral nerve block    Last Vitals:  Filed Vitals:   09/01/15 1316 09/01/15 1615  BP: 151/101 186/89  Pulse: 101   Temp: 36.7 C 36.3 C  Resp: 18     Last Pain:  Filed Vitals:   09/01/15 1621  PainSc: 10-Worst pain ever                 Catalina Gravel

## 2015-09-01 NOTE — Discharge Instructions (Signed)
Wear knee immobilizer at all times. Do NOT bend your knee. Keep dressing and splint clean and dry. Do NOT remove. Non weightbearing right leg.
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
## 2015-09-01 NOTE — Transfer of Care (Signed)
Immediate Anesthesia Transfer of Care Note  Patient: Jesse Rubio  Procedure(s) Performed: Procedure(s): OPEN REDUCTION INTERNAL (ORIF) FIXATION RIGHT PATELLA (Right)  Patient Location: PACU  Anesthesia Type:General  Level of Consciousness: awake, alert  and oriented  Airway & Oxygen Therapy: Patient Spontanous Breathing and Patient connected to nasal cannula oxygen  Post-op Assessment: Report given to RN, Post -op Vital signs reviewed and stable and Patient moving all extremities X 4  Post vital signs: Reviewed and stable  Last Vitals:  Filed Vitals:   09/01/15 1316  BP: 151/101  Pulse: 101  Temp: 36.7 C  Resp: 18   HR 87, RR 17, BP 186/87, sats [id number]  Complications: No apparent anesthesia complications

## 2015-09-01 NOTE — Brief Op Note (Signed)
09/01/2015  3:20 PM  PATIENT:  Jesse Rubio  30 y.o. male  PRE-OPERATIVE DIAGNOSIS:  RIGHT PATELLA FRACTURE   POST-OPERATIVE DIAGNOSIS:  RIGHT PATELLA FRACTURE   PROCEDURE:  Procedure(s): OPEN REDUCTION INTERNAL (ORIF) FIXATION RIGHT PATELLA (Right)  SURGEON:  Surgeon(s) and Role:    * Rod Can, MD - Primary  PHYSICIAN ASSISTANT: none  ASSISTANTS: Ky Barban, RNFA   ANESTHESIA:   general  EBL:  Total I/O In: 1300 [I.V.:1300] Out: 100 [Blood:100]  BLOOD ADMINISTERED:none  DRAINS: none   LOCAL MEDICATIONS USED:  NONE  SPECIMEN:  No Specimen  DISPOSITION OF SPECIMEN:  N/A  COUNTS:  YES  TOURNIQUET:  * Missing tourniquet times found for documented tourniquets in log:  [id number] *  DICTATION: .Other Dictation: Dictation Number [id number]  PLAN OF CARE: Discharge to home after PACU  PATIENT DISPOSITION:  PACU - hemodynamically stable.   Delay start of Pharmacological VTE agent (>24hrs) due to surgical blood loss or risk of bleeding: no
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
## 2015-09-01 NOTE — H&P (Signed)
PREOPERATIVE H&P  Chief Complaint: RIGHT PATELLA FRACTURE   HPI: Jesse Rubio is a 30 y.o. male who presents for preoperative history and physical with a diagnosis of RIGHT PATELLA FRACTURE . Also has nonop RIGHT foot fxs.  He has elected for surgical management.   Past Medical History  Diagnosis Date  . Restless leg syndrome   . Medical history non-contributory    Past Surgical History  Procedure Laterality Date  . Vasectomy  2014  . Hernia repair      inguinal hernia repair as a baby   Social History   Social History  . Marital Status: Married    Spouse Name: N/A  . Number of Children: N/A  . Years of Education: N/A   Social History Main Topics  . Smoking status: Current Every Day Smoker -- 1.00 packs/day    Types: Cigarettes  . Smokeless tobacco: Former Systems developer  . Alcohol Use: Yes     Comment: occasional  . Drug Use: No  . Sexual Activity: Not Asked   Other Topics Concern  . None   Social History Narrative   Family History  Problem Relation Age of Onset  . Hypertension Mother   . Cirrhosis Father    Allergies  Allergen Reactions  . Chantix [Varenicline]     Disturbing dreams  . Wellbutrin [Bupropion] Other (See Comments)    migraines   Prior to Admission medications   Medication Sig Start Date End Date Taking? Authorizing Provider  acetaminophen (TYLENOL) 500 MG tablet Take 1,000 mg by mouth every 8 (eight) hours as needed for mild pain or moderate pain.   Yes Historical Provider, MD  cyclobenzaprine (FLEXERIL) 10 MG tablet Take 1 tablet (10 mg total) by mouth 2 (two) times daily as needed for muscle spasms. 08/28/15  Yes Jenifer Algernon Huxley, MD  HYDROcodone-acetaminophen (NORCO/VICODIN) 5-325 MG tablet Take 1 tablet by mouth every 4 (four) hours as needed for severe pain. 08/28/15  Yes Jenifer Algernon Huxley, MD     Positive ROS: All other systems have been reviewed and were otherwise negative with the exception of those mentioned in the HPI and as  above.  Physical Exam: General: Alert, no acute distress Cardiovascular: No pedal edema Respiratory: No cyanosis, no use of accessory musculature GI: No organomegaly, abdomen is soft and non-tender Skin: No lesions in the area of chief complaint Neurologic: Sensation intact distally Psychiatric: Patient is competent for consent with normal mood and affect Lymphatic: No axillary or cervical lymphadenopathy  MUSCULOSKELETAL: RLE: skin intact. Cannot SLR. SL splint intact.  Assessment: RIGHT PATELLA FRACTURE   Plan: Plan for Procedure(s): OPEN REDUCTION INTERNAL (ORIF) FIXATION RIGHT PATELLA  The risks benefits and alternatives were discussed with the patient including but not limited to the risks of nonoperative treatment, versus surgical intervention including infection, bleeding, nerve injury,  blood clots, cardiopulmonary complications, morbidity, mortality, among others, and they were willing to proceed.   Lailie Smead, Horald Pollen, MD Cell [phone]   09/01/2015 1:45 PM

## 2015-09-02 ENCOUNTER — Encounter (HOSPITAL_COMMUNITY): Payer: Self-pay | Admitting: Orthopedic Surgery

## 2015-10-06 ENCOUNTER — Encounter: Payer: Self-pay | Admitting: Internal Medicine

## 2015-10-06 ENCOUNTER — Ambulatory Visit (INDEPENDENT_AMBULATORY_CARE_PROVIDER_SITE_OTHER): Payer: Commercial Managed Care - HMO | Admitting: Internal Medicine

## 2015-10-06 VITALS — BP 122/90 | HR 85 | Temp 98.1°F | Wt 201.0 lb

## 2015-10-06 DIAGNOSIS — K625 Hemorrhage of anus and rectum: Secondary | ICD-10-CM | POA: Diagnosis not present

## 2015-10-06 MED ORDER — HYDROCORTISONE 2.5 % EX CREA
TOPICAL_CREAM | Freq: Three times a day (TID) | CUTANEOUS | Status: DC | PRN
Start: 2015-10-06 — End: 2016-07-09

## 2015-10-06 NOTE — Assessment & Plan Note (Signed)
Clearly seems to be a local source of blood though none seen Discussed witch hazel and adding miralax No signs of systemic disease HC cream-- 2.5%
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
## 2015-10-06 NOTE — Patient Instructions (Signed)
Please add miralax 1 capful with full glass of water daily--continue the other meds. Try witch hazel pads for wiping (Tucks) If the bleeding continues, I can set you up with a referral.

## 2015-10-06 NOTE — Progress Notes (Signed)
Pre visit review using our clinic review tool, if applicable. No additional management support is needed unless otherwise documented below in the visit note. 

## 2015-11-27 NOTE — Telephone Encounter (Signed)
Hycodan Rx was never picked up by Mr. Gieger.  Rx shredded on 11/27/2015 at 4:18 pm

## 2016-02-27 ENCOUNTER — Ambulatory Visit: Payer: Managed Care, Other (non HMO) | Admitting: Internal Medicine

## 2016-06-03 ENCOUNTER — Other Ambulatory Visit: Payer: Self-pay | Admitting: Internal Medicine

## 2016-07-02 ENCOUNTER — Other Ambulatory Visit: Payer: Self-pay | Admitting: Family Medicine

## 2016-07-02 ENCOUNTER — Encounter: Payer: Self-pay | Admitting: Family Medicine

## 2016-07-02 MED ORDER — NICOTINE 21 MG/24HR TD PT24: 21.0000 mg | MEDICATED_PATCH | Freq: Every day | TRANSDERMAL | 0 refills | Status: DC

## 2016-07-09 ENCOUNTER — Ambulatory Visit (INDEPENDENT_AMBULATORY_CARE_PROVIDER_SITE_OTHER): Payer: Managed Care, Other (non HMO) | Admitting: Podiatry

## 2016-07-09 ENCOUNTER — Ambulatory Visit (INDEPENDENT_AMBULATORY_CARE_PROVIDER_SITE_OTHER): Payer: Managed Care, Other (non HMO)

## 2016-07-09 ENCOUNTER — Encounter: Payer: Self-pay | Admitting: Podiatry

## 2016-07-09 VITALS — BP 130/82 | HR 89 | Resp 16 | Ht 73.0 in | Wt 200.0 lb

## 2016-07-09 DIAGNOSIS — M79674 Pain in right toe(s): Secondary | ICD-10-CM

## 2016-07-09 DIAGNOSIS — M7751 Other enthesopathy of right foot: Secondary | ICD-10-CM | POA: Diagnosis not present

## 2016-07-09 DIAGNOSIS — M779 Enthesopathy, unspecified: Secondary | ICD-10-CM

## 2016-07-09 DIAGNOSIS — M778 Other enthesopathies, not elsewhere classified: Secondary | ICD-10-CM

## 2016-07-09 DIAGNOSIS — D3610 Benign neoplasm of peripheral nerves and autonomic nervous system, unspecified: Secondary | ICD-10-CM
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
## 2016-07-10 NOTE — Progress Notes (Signed)
Subjective:     Patient ID: Jesse Rubio, male   DOB: 1986/01/16, 31 y.o.   MRN: [id number]  HPI patient presents with shooting pain between the third and fourth toe of the right foot that's been more recent in duration and is worse with shoe gear   Review of Systems  All other systems reviewed and are negative.      Objective:   Physical Exam  Constitutional: He is oriented to person, place, and time.  Cardiovascular: Intact distal pulses.   Musculoskeletal: Normal range of motion.  Neurological: He is oriented to person, place, and time.  Skin: Skin is warm.  Nursing note and vitals reviewed.  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 May also be inflammatory capsulitis which we will explore  X-ray indicates no sign of stress fracture or advanced arthritis

## 2016-07-22 ENCOUNTER — Ambulatory Visit: Payer: Managed Care, Other (non HMO) | Admitting: Podiatry

## 2016-07-26 ENCOUNTER — Ambulatory Visit: Payer: Managed Care, Other (non HMO) | Admitting: Podiatry

## 2016-07-26 ENCOUNTER — Other Ambulatory Visit: Payer: Self-pay | Admitting: Family Medicine

## 2016-07-30 ENCOUNTER — Ambulatory Visit: Payer: Managed Care, Other (non HMO) | Admitting: Podiatry

## 2016-08-17 ENCOUNTER — Other Ambulatory Visit: Payer: Self-pay | Admitting: Family Medicine

## 2017-02-02 ENCOUNTER — Ambulatory Visit: Payer: Managed Care, Other (non HMO) | Admitting: Family Medicine

## 2017-02-10 ENCOUNTER — Ambulatory Visit: Payer: Managed Care, Other (non HMO) | Admitting: Family Medicine

## 2017-02-11 ENCOUNTER — Ambulatory Visit: Payer: Self-pay

## 2017-02-11 ENCOUNTER — Encounter: Payer: Self-pay | Admitting: Sports Medicine

## 2017-02-11 ENCOUNTER — Ambulatory Visit: Payer: Managed Care, Other (non HMO) | Admitting: Sports Medicine

## 2017-02-11 ENCOUNTER — Ambulatory Visit (INDEPENDENT_AMBULATORY_CARE_PROVIDER_SITE_OTHER): Payer: Managed Care, Other (non HMO) | Admitting: Sports Medicine

## 2017-02-11 VITALS — BP 124/84 | HR 79 | Ht 73.0 in | Wt 210.6 lb

## 2017-02-11 DIAGNOSIS — M7650 Patellar tendinitis, unspecified knee: Secondary | ICD-10-CM | POA: Diagnosis not present

## 2017-02-11 DIAGNOSIS — M25561 Pain in right knee: Secondary | ICD-10-CM | POA: Diagnosis not present

## 2017-02-11 DIAGNOSIS — M67969 Unspecified disorder of synovium and tendon, unspecified lower leg: Secondary | ICD-10-CM | POA: Insufficient documentation

## 2017-02-11 DIAGNOSIS — Z720 Tobacco use: Secondary | ICD-10-CM

## 2017-02-11 DIAGNOSIS — Z8781 Personal history of (healed) traumatic fracture: Secondary | ICD-10-CM

## 2017-02-11 MED ORDER — NITROGLYCERIN 0.2 MG/HR TD PT24: MEDICATED_PATCH | TRANSDERMAL | 1 refills | Status: DC

## 2017-02-11 NOTE — Patient Instructions (Addendum)
Nitroglycerin Protocol   Apply 1/4 nitroglycerin patch to affected area daily.  Change position of patch within the affected area every 24 hours.  You may experience a headache during the first 1-2 weeks of using the patch, these should subside.  If you experience headaches after beginning nitroglycerin patch treatment, you may take your preferred over the counter pain reliever.  Another side effect of the nitroglycerin patch is skin irritation or rash related to patch adhesive.  Please notify our office if you develop more severe headaches or rash, and stop the patch.  Tendon healing with nitroglycerin patch may require 12 to 24 weeks depending on the extent of injury.  Men should not use if taking Viagra, Cialis, or Levitra.   Do not use if you have migraines or rosacea.   I recommend that you obtain over-the-counter SOLE  medium cushioned insoles.  These can be found at [employer] - or on-line at [employer]  Search for "SOLE Active Medium Shoe Insoles"
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
## 2017-02-15 NOTE — Assessment & Plan Note (Signed)
Chronic tendinopathy in setting of prior ORIF of the patella with indwelling hardware that appears to be intact.  This is quite an extreme case of patellar tendinosis but should respond to biologic treatment with nitroglycerin protocol.  Could consider Tenex and/or PRP but would like to begin with conservative measures.  Recommend isometric quad strengthening exercises to be performed on a daily basis.    Additionally increased cushioning shoes with longitudinal arch four-point beneficial given his underlying pes cavus feet.

## 2017-02-15 NOTE — Procedures (Signed)
LIMITED MSK ULTRASOUND OF Right Knee Images were obtained and interpreted by myself, Teresa Coombs, DO  Images have been saved and stored to PACS system. Images obtained on: GE S7 Ultrasound machine  FINDINGS:   Postsurgical hardware is intact with marked hypoechoic change within the patellar tendon is intact and well approximated.  The inferior pole of the patella is slightly fragmented but no evidence of nonunion  Tibial tubercle insertion is normal appearing.  There is hypertrophy is of Hoffa's fat pad with only minimal neovascularity.  There is no increased neovascularity in the tenon and peritenon of the patellar tendon.      IMPRESSION:  1. Status post ORIF right patella fracture with Chronic patellar tendinopathy with minimal neovascularity

## 2017-02-15 NOTE — Assessment & Plan Note (Signed)
This is likely contributing significantly to the lack of healing within the patellar tendon itself.  There is significant to neuropathic changes on MSK ultrasound with minimal neovascularity suggesting poor microvascular healing.  Smoking cessation emphasized.

## 2017-02-15 NOTE — Assessment & Plan Note (Signed)
Patellar tendon does appear to be intact, Per the patient's report, Dr. Lyla Glassing has signed off from a surgical standpoint and believes this be more of a soft tissue issue. No restrictions reported per patient

## 2017-03-20 ENCOUNTER — Other Ambulatory Visit: Payer: Self-pay | Admitting: Family Medicine

## 2017-03-21 ENCOUNTER — Encounter: Payer: Self-pay | Admitting: Sports Medicine

## 2017-03-25 ENCOUNTER — Ambulatory Visit: Payer: Managed Care, Other (non HMO) | Admitting: Sports Medicine

## 2017-06-24 ENCOUNTER — Ambulatory Visit: Payer: Managed Care, Other (non HMO) | Admitting: Family Medicine

## 2017-06-24 ENCOUNTER — Encounter: Payer: Self-pay | Admitting: Family Medicine

## 2017-06-24 VITALS — BP 110/70 | HR 68 | Temp 98.3°F | Wt 208.0 lb

## 2017-06-24 DIAGNOSIS — M25561 Pain in right knee: Secondary | ICD-10-CM

## 2017-06-24 DIAGNOSIS — G8929 Other chronic pain: Secondary | ICD-10-CM | POA: Diagnosis not present

## 2017-06-24 MED ORDER — AMITRIPTYLINE HCL 25 MG PO TABS: ORAL_TABLET | ORAL | 1 refills | Status: DC

## 2017-06-24 MED ORDER — MELOXICAM 15 MG PO TABS: 15.0000 mg | ORAL_TABLET | Freq: Every day | ORAL | 0 refills | Status: DC

## 2017-06-24 NOTE — Patient Instructions (Signed)
I have sent two medications to your pharmacy- meloxicam and amitriptyline  Please follow up with Dr. Paulla Fore

## 2017-06-24 NOTE — Progress Notes (Signed)
Subjective:    Patient ID: Jesse Rubio, male    DOB: 07-Feb-1986, 32 y.o.   MRN: [id number]  HPI This is a 32 yo male, accompanied by his wife and school-age son, who presents today with right knee pain for 2 years. He was seen 02/11/17 by Dr. Paulla Fore. He is having constant pain, interfering with sleep and work.  He works as an Clinical biochemist.  Has not taken any medication recently, pain 8/10. Was released by Dr. Anderson Malta to the patient, he could not do anything else for him.  Wears brace off and on without relief. Pain in front of knee, pain is sharp, burning.  Cutting back on smoking with vaping.  Has tried CBD oil without relief. No relief with heat or ice.   Past Medical History:  Diagnosis Date  . Restless leg syndrome    Past Surgical History:  Procedure Laterality Date  . HERNIA REPAIR     inguinal hernia repair as a baby  . ORIF PATELLA Right 09/01/2015   Procedure: OPEN REDUCTION INTERNAL (ORIF) FIXATION RIGHT PATELLA;  Surgeon: Rod Can, MD;  Location: [place];  Service: Orthopedics;  Laterality: Right;  Marland Kitchen VASECTOMY  2014   Family History  Problem Relation Age of Onset  . Hypertension Mother   . Cirrhosis Father    Social History   Tobacco Use  . Smoking status: Current Every Day Smoker    Packs/day: 1.00    Types: Cigarettes  . Smokeless tobacco: Former Network engineer Use Topics  . Alcohol use: Yes    Comment: occasional  . Drug use: No      Review of Systems Per HPI    Objective:   Physical Exam  Constitutional: He is oriented to person, place, and time. He appears well-developed and well-nourished. No distress.  HENT:  Head: Normocephalic and atraumatic.  Eyes: Conjunctivae are normal.  Cardiovascular: Normal rate.  Pulmonary/Chest: Effort normal.  Musculoskeletal:  Right knee with well healed surgical scar. No erythema or edema.    Neurological: He is alert and oriented to person, place, and time.  Skin: Skin is warm and dry. He is not  diaphoretic.  Psychiatric: He has a normal mood and affect. His behavior is normal. Judgment and thought content normal.  Vitals reviewed.     BP 110/70 (BP Location: Left Arm, Patient Position: Sitting, Cuff Size: Large)   Pulse 68   Temp 98.3 F (36.8 C) (Oral)   Wt 208 lb (94.3 kg)   SpO2 98%   BMI 27.44 kg/m  Wt Readings from Last 3 Encounters:  06/24/17 208 lb (94.3 kg)  02/11/17 210 lb 9.6 oz (95.5 kg)  07/09/16 200 lb (90.7 kg)       Assessment & Plan:  1. Chronic pain of right knee -Patient has been seen by orthopedics and sports medicine and there is very little that I am able to offer him today, I do not feel that narcotic pain medication is appropriate in the setting of this chronic pain -Reviewed Dr. Nicolasa Ducking note and follow-up email with patient and encouraged patient to follow-up with Dr. Paulla Fore -Discussed importance of smoking cessation as Dr. Paulla Fore had done -We will provide some NSAID and amitriptyline for bedtime to see if we can decrease pain and increase his ability to sleep - meloxicam (MOBIC) 15 MG tablet; Take 1 tablet (15 mg total) by mouth daily.  Dispense: 30 tablet; Refill: 0 - amitriptyline (ELAVIL) 25 MG tablet; Take 1 tablet at bedtime for  1 week, then can increase to 2 tablets  Dispense: 60 tablet; Refill: [place], FNP-BC  [facility], [facility]  06/24/2017 5:41 PM

## 2017-07-11 ENCOUNTER — Encounter: Payer: Self-pay | Admitting: Sports Medicine

## 2017-07-11 ENCOUNTER — Encounter: Payer: Self-pay | Admitting: Family Medicine

## 2017-07-12 ENCOUNTER — Other Ambulatory Visit: Payer: Self-pay | Admitting: Sports Medicine

## 2017-07-12 MED ORDER — MONTELUKAST SODIUM 10 MG PO TABS: 10.0000 mg | ORAL_TABLET | Freq: Every day | ORAL | 3 refills | Status: DC

## 2017-07-13 ENCOUNTER — Telehealth: Payer: Self-pay | Admitting: Sports Medicine

## 2017-07-13 NOTE — Telephone Encounter (Signed)
See note

## 2017-07-13 NOTE — Telephone Encounter (Signed)
Last OV 01/2017. Forwarding to Dr. Paulla Fore to advise.

## 2017-07-13 NOTE — Telephone Encounter (Signed)
Copied from [place] [id number]. Topic: Referral - Request >> Jul 13, 2017  2:12 PM [name], [name] wrote: Reason for CRM: Patient called and said that he needs a referral to [facility] since he is treating him for his knee. Please call patient back, thanks.

## 2017-07-14 NOTE — Telephone Encounter (Signed)
Okay to place referral to [facility] for chronic right knee pain.

## 2017-07-14 NOTE — Telephone Encounter (Signed)
Pt calling and would like to speak with Dr. Paulla Fore regarding this referral.

## 2017-07-15 ENCOUNTER — Other Ambulatory Visit: Payer: Self-pay

## 2017-07-15 DIAGNOSIS — M25561 Pain in right knee: Principal | ICD-10-CM

## 2017-07-15 DIAGNOSIS — G8929 Other chronic pain: Secondary | ICD-10-CM

## 2017-07-15 NOTE — Telephone Encounter (Signed)
Referral has been placed. Called pt and left VM to call the office.  

## 2017-07-28 ENCOUNTER — Ambulatory Visit: Payer: Managed Care, Other (non HMO) | Admitting: Internal Medicine

## 2017-08-19 ENCOUNTER — Other Ambulatory Visit: Payer: Self-pay | Admitting: Family Medicine

## 2017-08-19 DIAGNOSIS — M25561 Pain in right knee: Principal | ICD-10-CM

## 2017-08-19 DIAGNOSIS — G8929 Other chronic pain: Secondary | ICD-10-CM

## 2017-08-22 NOTE — Telephone Encounter (Signed)
Electronic refill request Last office visit 06/24/17 Last refill 06/24/17 #60/1

## 2017-10-03 ENCOUNTER — Other Ambulatory Visit: Payer: Self-pay | Admitting: Physical Therapy

## 2017-10-03 ENCOUNTER — Encounter: Payer: Self-pay | Admitting: Sports Medicine

## 2017-10-03 DIAGNOSIS — G8929 Other chronic pain: Secondary | ICD-10-CM

## 2017-10-03 DIAGNOSIS — M25561 Pain in right knee: Principal | ICD-10-CM

## 2017-10-10 ENCOUNTER — Telehealth: Payer: Self-pay | Admitting: Sports Medicine

## 2017-10-10 NOTE — Telephone Encounter (Signed)
Copied from [place] [id number]. Topic: Referral - Status >> Oct 10, 2017  9:40 AM [name], [name] wrote: Reason for CRM: Patient wife called and said that [facility] has not yet received the referral for patient. Please resend referral, thanks.

## 2017-10-10 NOTE — Telephone Encounter (Signed)
Jesse Rubio spoke with [facility] earlier today and they confirmed that they DID received original referral from 07/15/17. They advised that they need more OV notes. Unfortunately we do not have any additional OV notes to provide. They will review records and contact Jesse Rubio if they agree to see him. Jesse Rubio has responded to Jesse Rubio via MyChart.

## 2017-10-25 NOTE — Telephone Encounter (Signed)
Ryan called from [employer] - he stated that the provider declined to accept the referral after reviewing the notes.  cb is [phone]

## 2017-10-25 NOTE — Telephone Encounter (Signed)
Referral was entered under Dr. Paulla Fore.

## 2017-10-26 ENCOUNTER — Other Ambulatory Visit: Payer: Self-pay

## 2017-10-26 ENCOUNTER — Encounter: Payer: Self-pay | Admitting: Sports Medicine

## 2017-10-26 DIAGNOSIS — M25561 Pain in right knee: Principal | ICD-10-CM

## 2017-10-26 DIAGNOSIS — G8929 Other chronic pain: Secondary | ICD-10-CM

## 2017-10-26 NOTE — Telephone Encounter (Signed)
Responded to pt via MyChart. New referral placed to Dr. Dossie Arbour per pt request.

## 2017-11-09 ENCOUNTER — Ambulatory Visit: Payer: Managed Care, Other (non HMO) | Admitting: Nurse Practitioner

## 2017-11-14 ENCOUNTER — Encounter: Payer: Self-pay | Admitting: Nurse Practitioner

## 2017-11-14 ENCOUNTER — Other Ambulatory Visit: Payer: Self-pay

## 2017-11-14 ENCOUNTER — Ambulatory Visit: Payer: Managed Care, Other (non HMO) | Attending: Nurse Practitioner | Admitting: Nurse Practitioner

## 2017-11-14 VITALS — BP 157/110 | HR 85 | Temp 98.2°F | Resp 16 | Ht 73.0 in | Wt 210.0 lb

## 2017-11-14 DIAGNOSIS — Z9889 Other specified postprocedural states: Secondary | ICD-10-CM | POA: Diagnosis not present

## 2017-11-14 DIAGNOSIS — G8929 Other chronic pain: Secondary | ICD-10-CM

## 2017-11-14 DIAGNOSIS — F1910 Other psychoactive substance abuse, uncomplicated: Secondary | ICD-10-CM | POA: Diagnosis not present

## 2017-11-14 DIAGNOSIS — F1990 Other psychoactive substance use, unspecified, uncomplicated: Secondary | ICD-10-CM | POA: Diagnosis not present

## 2017-11-14 DIAGNOSIS — F1190 Opioid use, unspecified, uncomplicated: Secondary | ICD-10-CM | POA: Diagnosis not present

## 2017-11-14 DIAGNOSIS — F17210 Nicotine dependence, cigarettes, uncomplicated: Secondary | ICD-10-CM | POA: Insufficient documentation

## 2017-11-14 DIAGNOSIS — M25561 Pain in right knee: Secondary | ICD-10-CM | POA: Insufficient documentation

## 2017-11-14 DIAGNOSIS — G894 Chronic pain syndrome: Secondary | ICD-10-CM | POA: Diagnosis not present

## 2017-11-14 NOTE — Progress Notes (Signed)
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 problems, low energy levels due to low hemoglobin or being anemic Endocrine History: No reported endocrine signs or symptoms such as high or low blood sugar, rapid heart rate due to high thyroid levels, obesity or weight gain due to slow thyroid or thyroid disease Rheumatologic History: No reported rheumatological signs and symptoms such as fatigue, joint pain, tenderness, swelling, redness, heat, stiffness, decreased range of motion, with or without associated rash Musculoskeletal History: Negative for myasthenia gravis, muscular dystrophy, multiple sclerosis or malignant hyperthermia Work History: Working full time  Allergies  Mr. Posten is allergic to chantix [varenicline] and wellbutrin [bupropion].  Laboratory Chemistry  Inflammation Markers Lab Results  Component Value Date   CRP 1.6 03/28/2013   ESRSEDRATE 10  03/28/2013   (CRP: Acute Phase) (ESR: Chronic Phase) Renal Function Markers Lab Results  Component Value Date   BUN 6 08/27/2015   CREATININE 0.81 08/27/2015   GFRAA >60 08/27/2015   GFRNONAA >60 08/27/2015   Hepatic Function Markers Lab Results  Component Value Date   AST 27 08/27/2015   ALT 31 08/27/2015   ALBUMIN 3.8 08/27/2015   ALKPHOS 67 08/27/2015   Electrolytes Lab Results  Component Value Date   NA 140 08/27/2015   K 3.5 08/27/2015   CL 103 08/27/2015   CALCIUM 9.1 08/27/2015   Neuropathy Markers No results found for: [id number] Bone Pathology Markers Lab Results  Component Value Date   ALKPHOS 67 08/27/2015   CALCIUM 9.1 08/27/2015   TESTOSTERONE 321.18 (L) 03/28/2013   Coagulation Parameters Lab Results  Component Value Date   PLT 408 (H) 09/01/2015   Cardiovascular Markers Lab Results  Component Value Date   HGB 15.5 09/01/2015   HCT 45.2 09/01/2015   Note: Lab results reviewed.  PFSH  Drug: Mr. Opdahl  reports that he has current or past drug history. Drug: Oxycodone. Alcohol:  reports that he drinks about 1.2 oz of alcohol per week. Tobacco:  reports that he has been smoking cigarettes.  He has been smoking about 1.00 pack per day. He has quit using smokeless tobacco. Medical:  has a past medical history of Restless leg syndrome. Family: family history includes Cirrhosis in his father; Hypertension in his mother.  Past Surgical History:  Procedure Laterality Date  . HERNIA REPAIR     inguinal hernia repair as a baby  . ORIF PATELLA Right 09/01/2015   Procedure: OPEN REDUCTION INTERNAL (ORIF) FIXATION RIGHT PATELLA;  Surgeon: Rod Can, MD;  Location: [facility];  Service: Orthopedics;  Laterality: Right;  Marland Kitchen VASECTOMY  2014   Active Ambulatory Problems  Diagnosis Date Noted  . Tobacco use 03/23/2013  . Polyarthralgia 03/23/2013  . Other malaise and fatigue 03/23/2013  . Insomnia 03/23/2013  . Erectile dysfunction 03/23/2013  . Left  knee pain 03/23/2013  . 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 Chronic lower back pain 07/12/2013  . Acute pharyngitis 10/16/2013  . Cough 05/23/2015  . Rectal bleeding 10/06/2015  . History of patellar fracture 02/11/2017  . 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 Closed fracture of zygomatic arch ([place]) 08/31/2015  . Chronic pain of right knee (Primary Area of Pain) 11/14/2017  . Chronic pain syndrome 11/14/2017  . Opiate use 11/14/2017  . Substance use disorder 11/14/2017   Resolved Ambulatory Problems    Diagnosis Date Noted  . No Resolved Ambulatory Problems   Past Medical History:  Diagnosis Date  . Restless leg syndrome    Constitutional Exam  General appearance: Well nourished, well developed, and well hydrated. In no apparent acute distress Vitals:   11/14/17 1306  BP: (!) 157/110  Pulse: 85  Resp: 16  Temp: 98.2 F (36.8 C)  TempSrc: Oral  SpO2: 100%  Weight: 210 lb (95.3 kg)  Height: _0  (1.854 m)   BMI Assessment: Estimated body mass index is 27.71 kg/m as calculated from the following:   Height as of this encounter: _1  (1.854 m).   Weight as of this encounter: 210 lb (95.3 kg).  BMI interpretation table: BMI level Category Range association with higher incidence of chronic pain  <18 kg/m2 Underweight   18.5-24.9 kg/m2 Ideal body weight   25-29.9 kg/m2 Overweight Increased incidence by 20%  30-34.9 kg/m2 Obese (Class I) Increased incidence by 68%  35-39.9 kg/m2 Severe obesity (Class II) Increased incidence by 136%  >40 kg/m2 Extreme obesity (Class III) Increased incidence by 254%   BMI Readings from Last 4 Encounters:  11/14/17 27.71 kg/m  06/24/17 27.44 kg/m  02/11/17 27.79 kg/m  07/09/16 26.39 kg/m   Wt Readings from Last 4 Encounters:  11/14/17 210 lb (95.3 kg)  06/24/17 208 lb (94.3 kg)  02/11/17 210 lb 9.6 oz (95.5 kg)  07/09/16 200 lb (90.7 kg)  Psych/Mental status: Alert, oriented x 3 (person, place, & time)       Eyes:  PERLA Respiratory: No evidence of acute respiratory distress  Gait & Posture Assessment  Ambulation: Unassisted Gait: Relatively normal for age and body habitus Posture: WNL   Lower Extremity Exam    Side: Right lower extremity  Side: Left lower extremity  Inspection:well-healed scar from previous surgery  Inspection: No masses, redness, swelling, or asymmetry. No contractures  Functional ROM: Unrestricted ROM          Functional ROM: Unrestricted ROM          Muscle strength & Tone: Functionally intact  Muscle strength & Tone: Functionally intact  Sensory: Unimpaired  Sensory: Unimpaired  Palpation: Tender  Palpation: No palpable anomalies   Assessment  Primary Diagnosis & Pertinent Problem List: The primary encounter diagnosis was Chronic pain of right knee (Primary Area of Pain). Diagnoses of Chronic pain syndrome, Opiate use, and Substance use disorder were also pertinent to this visit.  Visit Diagnosis: 1. Chronic pain of right knee (Primary Area of Pain)   2. Chronic pain syndrome   3. Opiate use   4. Substance use disorder    Plan of Care  Initial treatment plan:  Please be advised that as per protocol, today's visit has been an evaluation only. We have not taken over the  patient's controlled substance management.  Problem-specific plan: No problem-specific Assessment & Plan notes found for this encounter.  Ordered Lab-work, Procedure(s), Referral(s), & Consult(s): Orders Placed This Encounter  Procedures  . Ambulatory referral to Psychology   Pharmacotherapy: Medications ordered:  No orders of the defined types were placed in this encounter.  Medications administered during this visit: Gwyndolyn Saxon A. Hedlund had no medications administered during this visit.   Pharmacotherapy under consideration:  Opioid Analgesics: The patient was informed that there is no guarantee that he would be a candidate for opioid analgesics. The decision will be made following CDC guidelines.  This decision will be based on the results of diagnostic studies, as well as Mr. Babington's risk profile. At the end of visit patient admits he hasn't substance use disorder. Declines any type of interventional therapy Membrane stabilizer: To be determined at a later time Muscle relaxant: To be determined at a later time NSAID: To be determined at a later time Other analgesic(s): To be determined at a later time   Interventional therapies under consideration: Mr. Meir was informed that there is no guarantee that he would be a candidate for interventional therapies. The decision will be based on the results of diagnostic studies, as well as Mr. Crance's risk profile.  Possible procedure(s): Declined by patient    Provider-requested follow-up: No follow-ups on file.  No future appointments.  Primary Care Physician: Lucille Passy, MD Location: [facility] Note by:  Date: 11/14/2017; Time: 3:43 PM  Pain Score Disclaimer: We use the NRS-11 scale. This is a self-reported, subjective measurement of pain severity with only modest accuracy. It is used primarily to identify changes within a particular patient. It must be understood that outpatient pain scales are significantly less accurate that those used for research, where they can be applied under ideal controlled circumstances with minimal exposure to variables. In reality, the score is likely to be a combination of pain intensity and pain affect, where pain affect describes the degree of emotional arousal or changes in action readiness caused by the sensory experience of pain. Factors such as social and work situation, setting, emotional state, anxiety levels, expectation, and prior pain experience may influence pain perception and show large inter-individual differences that may also be affected by time variables.  Patient instructions provided during this appointment: Patient Instructions    ____________________________________________________________________________________________  Appointment Policy Summary  It is our goal and responsibility to provide the medical community with assistance in the evaluation and management of patients with chronic pain. Unfortunately our resources are limited. Because we do not have an unlimited amount of time, or available appointments, we are required to closely monitor and manage their use. The following rules exist to maximize their use:  Patient's responsibilities: 1. Punctuality:  At what time should I arrive? You should be physically present in our office 30 minutes before your scheduled appointment. Your scheduled appointment is with your assigned healthcare provider. However, it takes 5-10 minutes to be "checked-in", and another 15 minutes for the nurses to do the admission. If you arrive to our office at the time you were given for your appointment, you will end up being at least 20-25 minutes late to your appointment with the provider. 2. Tardiness:  What happens if I arrive only a few minutes after my scheduled appointment time? You will need to reschedule your appointment. The cutoff is your appointment time. This is why it is so important that you arrive at least 30 minutes before  that appointment. If you have an appointment scheduled for 10:00 AM and you arrive at 10:01, you will be required to reschedule your appointment.  3. Plan ahead:  Always assume that you will encounter traffic on your way in. Plan for it. If you are dependent on a driver, make sure they understand these rules and the need to arrive early. 4. Other appointments and responsibilities:  Avoid scheduling any other appointments before or after your pain clinic appointments.  5. Be prepared:  Write down everything that you need to discuss with your healthcare provider and give this information to the admitting nurse. Write down the medications that you will need  refilled. Bring your pills and bottles (even the empty ones), to all of your appointments, except for those where a procedure is scheduled. 6. No children or pets:  Find someone to take care of them. It is not appropriate to bring them in. 7. Scheduling changes:  We request "advanced notification" of any changes or cancellations. 8. Advanced notification:  Defined as a time period of more than 24 hours prior to the originally scheduled appointment. This allows for the appointment to be offered to other patients. 9. Rescheduling:  When a visit is rescheduled, it will require the cancellation of the original appointment. For this reason they both fall within the category of "Cancellations".  10. Cancellations:  They require advanced notification. Any cancellation less than 24 hours before the  appointment will be recorded as a "No Show". 11. No Show:  Defined as an unkept appointment where the patient failed to notify or declare to the practice their intention or inability to keep the appointment.  Corrective process for repeat offenders:  1. Tardiness: Three (3) episodes of rescheduling due to late arrivals will be recorded as one (1) "No Show". 2. Cancellation or reschedule: Three (3) cancellations or rescheduling will be recorded as one (1) "No Show". 3. "No Shows": Three (3) "No Shows" within a 12 month period will result in discharge from the practice. ____________________________________________________________________________________________  ____________________________________________________________________________________________  Pain Scale  Introduction: The pain score used by this practice is the Verbal Numerical Rating Scale (VNRS-11). This is an 11-point scale. It is for adults and children 10 years or older. There are significant differences in how the pain score is reported, used, and applied. Forget everything you learned in the past and learn this scoring system.  General  Information: The scale should reflect your current level of pain. Unless you are specifically asked for the level of your worst pain, or your average pain. If you are asked for one of these two, then it should be understood that it is over the past 24 hours.  Basic Activities of Daily Living (ADL): Personal hygiene, dressing, eating, transferring, and using restroom.  Instructions: Most patients tend to report their level of pain as a combination of two factors, their physical pain and their psychosocial pain. This last one is also known as "suffering" and it is reflection of how physical pain affects you socially and psychologically. From now on, report them separately. From this point on, when asked to report your pain level, report only your physical pain. Use the following table for reference.  Pain Clinic Pain Levels (0-5/10)  Pain Level Score  Description  No Pain 0   Mild pain 1 Nagging, annoying, but does not interfere with basic activities of daily living (ADL). Patients are able to eat, bathe, get dressed, toileting (being able to get on and off the toilet and  perform personal hygiene functions), transfer (move in and out of bed or a chair without assistance), and maintain continence (able to control bladder and bowel functions). Blood pressure and heart rate are unaffected. A normal heart rate for a healthy adult ranges from 60 to 100 bpm (beats per minute).   Mild to moderate pain 2 Noticeable and distracting. Impossible to hide from other people. More frequent flare-ups. Still possible to adapt and function close to normal. It can be very annoying and may have occasional stronger flare-ups. With discipline, patients may get used to it and adapt.   Moderate pain 3 Interferes significantly with activities of daily living (ADL). It becomes difficult to feed, bathe, get dressed, get on and off the toilet or to perform personal hygiene functions. Difficult to get in and out of bed or a chair  without assistance. Very distracting. With effort, it can be ignored when deeply involved in activities.   Moderately severe pain 4 Impossible to ignore for more than a few minutes. With effort, patients may still be able to manage work or participate in some social activities. Very difficult to concentrate. Signs of autonomic nervous system discharge are evident: dilated pupils (mydriasis); mild sweating (diaphoresis); sleep interference. Heart rate becomes elevated (>115 bpm). Diastolic blood pressure (lower number) rises above 100 mmHg. Patients find relief in laying down and not moving.   Severe pain 5 Intense and extremely unpleasant. Associated with frowning face and frequent crying. Pain overwhelms the senses.  Ability to do any activity or maintain social relationships becomes significantly limited. Conversation becomes difficult. Pacing back and forth is common, as getting into a comfortable position is nearly impossible. Pain wakes you up from deep sleep. Physical signs will be obvious: pupillary dilation; increased sweating; goosebumps; brisk reflexes; cold, clammy hands and feet; nausea, vomiting or dry heaves; loss of appetite; significant sleep disturbance with inability to fall asleep or to remain asleep. When persistent, significant weight loss is observed due to the complete loss of appetite and sleep deprivation.  Blood pressure and heart rate becomes significantly elevated. Caution: If elevated blood pressure triggers a pounding headache associated with blurred vision, then the patient should immediately seek attention at an urgent or emergency care unit, as these may be signs of an impending stroke.    Emergency Department Pain Levels (6-10/10)  Emergency Room Pain 6 Severely limiting. Requires emergency care and should not be seen or managed at an outpatient pain management facility. Communication becomes difficult and requires great effort. Assistance to reach the emergency department  may be required. Facial flushing and profuse sweating along with potentially dangerous increases in heart rate and blood pressure will be evident.   Distressing pain 7 Self-care is very difficult. Assistance is required to transport, or use restroom. Assistance to reach the emergency department will be required. Tasks requiring coordination, such as bathing and getting dressed become very difficult.   Disabling pain 8 Self-care is no longer possible. At this level, pain is disabling. The individual is unable to do even the most "basic" activities such as walking, eating, bathing, dressing, transferring to a bed, or toileting. Fine motor skills are lost. It is difficult to think clearly.   Incapacitating pain 9 Pain becomes incapacitating. Thought processing is no longer possible. Difficult to remember your own name. Control of movement and coordination are lost.   The worst pain imaginable 10 At this level, most patients pass out from pain. When this level is reached, collapse of the autonomic nervous system  occurs, leading to a sudden drop in blood pressure and heart rate. This in turn results in a temporary and dramatic drop in blood flow to the brain, leading to a loss of consciousness. Fainting is one of the body's self defense mechanisms. Passing out puts the brain in a calmed state and causes it to shut down for a while, in order to begin the healing process.    Summary: 1. Refer to this scale when providing [place] with your pain level. 2. Be accurate and careful when reporting your pain level. This will help with your care. 3. Over-reporting your pain level will lead to loss of credibility. 4. Even a level of 1/10 means that there is pain and will be treated at our facility. 5. High, inaccurate reporting will be documented as "Symptom Exaggeration", leading to loss of credibility and suspicions of possible secondary gains such as obtaining more narcotics, or wanting to appear disabled, for  fraudulent reasons. 6. Only pain levels of 5 or below will be seen at our facility. 7. Pain levels of 6 and above will be sent to the Emergency Department and the appointment cancelled. ____________________________________________________________________________________________

## 2017-11-14 NOTE — Patient Instructions (Signed)

## 2017-11-14 NOTE — Progress Notes (Signed)
Safety precautions to be maintained throughout the outpatient stay will include: orient to surroundings, keep bed in low position, maintain call bell within reach at all times, provide assistance with transfer out of bed and ambulation.  

## 2017-12-09 ENCOUNTER — Encounter: Payer: Managed Care, Other (non HMO) | Admitting: Family Medicine

## 2017-12-09 DIAGNOSIS — Z0289 Encounter for other administrative examinations: Secondary | ICD-10-CM

## 2017-12-28 ENCOUNTER — Telehealth: Payer: Self-pay | Admitting: Family Medicine

## 2017-12-28 NOTE — Telephone Encounter (Signed)
Copied from [place] [phone] [id number]. Topic: General - Other >> Dec 28, 2017  4:00 PM Gardiner Ramus wrote: Reason for CRM: pt is called and stated that he would like to toc from Arnette Norris at [facility] to [employer] at [facility]. Pt states he was a pt at [facility] transferred over to [facility].

## 2017-12-29 NOTE — Telephone Encounter (Signed)
Yes okay with me.

## 2017-12-29 NOTE — Telephone Encounter (Signed)
Are you both in agreement with TOC from TA to DG? Plz advise/thx dmf

## 2017-12-30 NOTE — Telephone Encounter (Signed)
OK with me.

## 2017-12-30 NOTE — Telephone Encounter (Signed)
Plz call pt to sched appt with DG/thx dmf

## 2018-01-09 NOTE — Telephone Encounter (Signed)
Patient scheduled TOC appt with DG on 6/28 but no show. I left msg for pt to reschedule.

## 2018-03-01 ENCOUNTER — Other Ambulatory Visit: Payer: Self-pay | Admitting: Family Medicine

## 2018-03-01 DIAGNOSIS — Z130 Encounter for screening for diseases of the blood and blood-forming organs and certain disorders involving the immune mechanism: Secondary | ICD-10-CM

## 2018-03-01 DIAGNOSIS — Z13220 Encounter for screening for lipoid disorders: Secondary | ICD-10-CM

## 2018-03-01 DIAGNOSIS — Z1389 Encounter for screening for other disorder: Secondary | ICD-10-CM

## 2018-03-10 ENCOUNTER — Ambulatory Visit: Payer: Managed Care, Other (non HMO) | Admitting: Family Medicine

## 2018-03-10 ENCOUNTER — Other Ambulatory Visit: Payer: Managed Care, Other (non HMO)

## 2018-03-10 ENCOUNTER — Encounter: Payer: Self-pay | Admitting: Family Medicine

## 2018-03-10 VITALS — BP 122/84 | HR 68 | Temp 98.7°F | Ht 73.0 in | Wt 214.4 lb

## 2018-03-10 DIAGNOSIS — R5383 Other fatigue: Secondary | ICD-10-CM

## 2018-03-10 DIAGNOSIS — R7989 Other specified abnormal findings of blood chemistry: Secondary | ICD-10-CM | POA: Diagnosis not present

## 2018-03-10 DIAGNOSIS — M79671 Pain in right foot: Secondary | ICD-10-CM

## 2018-03-10 DIAGNOSIS — Z7689 Persons encountering health services in other specified circumstances: Secondary | ICD-10-CM | POA: Diagnosis not present

## 2018-03-10 NOTE — Progress Notes (Signed)
Subjective:    Patient ID: Jesse Rubio, male    DOB: [birth year], 32 y.o.   MRN: [id number]  HPI This is a 32 yo male who presents today to establish care. Previously seen by Dr. Deborra Medina. Accompanied by his son. Works at a [employer]. Some stress, recent promotion. In general he enjoys his job.   Last CPE- scheduled for next wee Tdap- 08/27/15 Flu- declines Dental- regular Exercise- walks a lot at work  Chronic right knee pain. Taking tylenol (500 mg) 3 tablets in the morning 2 in the afternoon and 3 at bedtime.   Right toe pain- after accident, thinks when boot was taken off that it was broken or sprained. Every day pain at forefoot of sole. Saw Dr. Paulla Dolly 07/09/16 and was diagnosed with neuroma symptomatology with negative xray. Had injection with some improvement.   Decreased energy, has history of using oral and injected steroids when younger and has been told he has low testosterone.   Past Medical History:  Diagnosis Date  . Restless leg syndrome    Past Surgical History:  Procedure Laterality Date  . HERNIA REPAIR     inguinal hernia repair as a baby  . ORIF PATELLA Right 09/01/2015   Procedure: OPEN REDUCTION INTERNAL (ORIF) FIXATION RIGHT PATELLA;  Surgeon: Rod Can, MD;  Location: [facility];  Service: Orthopedics;  Laterality: Right;  Marland Kitchen VASECTOMY  2014   Family History  Problem Relation Age of Onset  . Hypertension Mother   . Cirrhosis Father    Social History   Tobacco Use  . Smoking status: Current Some Day Smoker    Packs/day: 1.00    Types: Cigarettes  . Smokeless tobacco: Former Network engineer Use Topics  . Alcohol use: Yes    Alcohol/week: 2.0 standard drinks    Types: 2 Cans of beer per week    Comment: occasional  . Drug use: Yes    Types: Oxycodone    Comment: suboxin     Review of Systems Per HPI    Objective:   Physical Exam Physical Exam  Constitutional: Oriented to person, place, and time. He appears well-developed and  well-nourished.  HENT:  Head: Normocephalic and atraumatic.  Eyes: Conjunctivae are normal.  Neck: Normal range of motion. Neck supple.  Cardiovascular: Normal rate, regular rhythm and normal heart sounds.   Pulmonary/Chest: Effort normal and breath sounds normal.  Musculoskeletal: No edema.  Some tenderness at base of third toe on sole. Toes with good ROM and normal color, no deformity.  Neurological: Alert and oriented to person, place, and time.  Skin: Skin is warm and dry.  Psychiatric: Normal mood and affect. Behavior is normal. Judgment and thought content normal.  Vitals reviewed.     BP 122/84 (BP Location: Left Arm, Patient Position: Sitting, Cuff Size: Normal)   Pulse 68   Temp 98.7 F (37.1 C) (Oral)   Ht 6\' 1"  (1.854 m)   Wt 214 lb 6.4 oz (97.3 kg)   SpO2 97%   BMI 28.29 kg/m  Wt Readings from Last 3 Encounters:  03/10/18 214 lb 6.4 oz (97.3 kg)  11/14/17 210 lb (95.3 kg)  06/24/17 208 lb (94.3 kg)       Assessment & Plan:  1. Encounter to establish care - overdue for CPE, is scheduled next week  2. Decreased energy - will check early am testosterone - Testosterone; Future  3. Low testosterone in male - Testosterone; Future  4. Right foot pain - previously  diagnosed with neuroma, provided some metatarsal cookies for him to try - discussed tylenol dosing    Clarene Reamer, FNP-BC  [facility], [place]  03/13/2018 8:47 PM

## 2018-03-10 NOTE — Patient Instructions (Addendum)
For inflammation can try turmeric, glucosamine, and/or tart cherry  Do not take more than 4,000 mg of Tylenol daily  Morton Neuralgia Morton neuralgia is a type of foot pain in the area closest to your toes. This area is sometimes called the ball of your foot. Morton neuralgia occurs when a branch of a nerve in your foot (digital nerve) becomes compressed. When this happens over a long period of time, the nerve can thicken (neuroma) and cause pain. This usually occurs between the third and fourth toe. Morton neuralgia can come and go but may get worse over time. What are the causes? Your digital nerve can become compressed and stretched at a point where it passes under a thick band of tissue that connects your toes (intermetatarsal ligament). Morton neuralgia can be caused by mild repetitive damage in this area. This type of damage can result from:  Activities such as running or jumping.  Wearing shoes that are too tight.  What increases the risk? You may be at risk for Morton neuralgia if you:  Are male.  Wear high heels.  Wear shoes that are narrow or tight.  Participate in activities that stretch your toes. These include: ? Running. ? [place]. ? Long-distance walking.  What are the signs or symptoms? The first symptom of Morton neuralgia is pain that spreads from the ball of your foot to your toes. It may feel like you are walking on a marble. Pain usually gets worse with walking and goes away at night. Other symptoms may include numbness and cramping of your toes. How is this diagnosed? Your health care provider will do a physical exam. When doing the exam, your health care provider may:  Squeeze your foot just behind your toe.  Ask you to move your toes to check for pain.  You may also have tests on your foot to confirm the diagnosis. These may include:  An X-ray.  An MRI.  How is this treated? Treatment for Morton neuralgia may be as simple as changing the kind of  shoes you wear. Other treatments may include:  Wearing a supportive pad (orthosis) under the front of your foot. This lifts your toe bones and takes pressure off the nerve.  Getting injections of numbing medicine and anti-inflammatory medicine (steroid) in the nerve.  Having surgery to remove part of the thickened nerve.  Follow these instructions at home:  Take medicine only as directed by your health care provider.  Wear soft-soled shoes with a wide toe area.  Stop activities that may be causing pain.  Elevate your foot when resting.  Massage your foot.  Apply ice to the injured area: ? Put ice in a plastic bag. ? Place a towel between your skin and the bag. ? Leave the ice on for 20 minutes, 2-3 times a day.  Keep all follow-up visits as directed by your health care provider. This is important. Contact a health care provider if:  Home care instructions are not helping you get better.  Your symptoms change or get worse. This information is not intended to replace advice given to you by your health care provider. Make sure you discuss any questions you have with your health care provider. Document Released: 09/06/2000 Document Revised: 11/06/2015 Document Reviewed: 08/01/2013 Elsevier Interactive Patient Education  [employer].

## 2018-03-13 ENCOUNTER — Encounter: Payer: Self-pay | Admitting: Family Medicine

## 2018-03-17 ENCOUNTER — Other Ambulatory Visit (INDEPENDENT_AMBULATORY_CARE_PROVIDER_SITE_OTHER): Payer: Managed Care, Other (non HMO)

## 2018-03-17 ENCOUNTER — Ambulatory Visit (INDEPENDENT_AMBULATORY_CARE_PROVIDER_SITE_OTHER): Payer: Managed Care, Other (non HMO) | Admitting: Family Medicine

## 2018-03-17 ENCOUNTER — Encounter: Payer: Managed Care, Other (non HMO) | Admitting: Family Medicine

## 2018-03-17 ENCOUNTER — Encounter: Payer: Self-pay | Admitting: Family Medicine

## 2018-03-17 VITALS — BP 138/88 | HR 91 | Temp 98.1°F | Ht 73.0 in | Wt 211.8 lb

## 2018-03-17 DIAGNOSIS — Z130 Encounter for screening for diseases of the blood and blood-forming organs and certain disorders involving the immune mechanism: Secondary | ICD-10-CM | POA: Diagnosis not present

## 2018-03-17 DIAGNOSIS — Z1389 Encounter for screening for other disorder: Secondary | ICD-10-CM

## 2018-03-17 DIAGNOSIS — Z1329 Encounter for screening for other suspected endocrine disorder: Secondary | ICD-10-CM

## 2018-03-17 DIAGNOSIS — N529 Male erectile dysfunction, unspecified: Secondary | ICD-10-CM

## 2018-03-17 DIAGNOSIS — Z13220 Encounter for screening for lipoid disorders: Secondary | ICD-10-CM | POA: Diagnosis not present

## 2018-03-17 DIAGNOSIS — Z0000 Encounter for general adult medical examination without abnormal findings: Secondary | ICD-10-CM

## 2018-03-17 DIAGNOSIS — R7989 Other specified abnormal findings of blood chemistry: Secondary | ICD-10-CM

## 2018-03-17 DIAGNOSIS — F909 Attention-deficit hyperactivity disorder, unspecified type: Secondary | ICD-10-CM | POA: Insufficient documentation

## 2018-03-17 DIAGNOSIS — R5383 Other fatigue: Secondary | ICD-10-CM | POA: Diagnosis not present

## 2018-03-17 DIAGNOSIS — E782 Mixed hyperlipidemia: Secondary | ICD-10-CM | POA: Insufficient documentation

## 2018-03-17 DIAGNOSIS — Z720 Tobacco use: Secondary | ICD-10-CM

## 2018-03-17 LAB — CBC WITH DIFFERENTIAL/PLATELET
BASOS PCT: 0.7 % (ref 0.0–3.0)
Basophils Absolute: 0.1 10*3/uL (ref 0.0–0.1)
EOS PCT: 3.2 % (ref 0.0–5.0)
Eosinophils Absolute: 0.3 10*3/uL (ref 0.0–0.7)
HEMATOCRIT: 45.6 % (ref 39.0–52.0)
HEMOGLOBIN: 15.7 g/dL (ref 13.0–17.0)
Lymphocytes Relative: 31.6 % (ref 12.0–46.0)
Lymphs Abs: 3.2 10*3/uL (ref 0.7–4.0)
MCHC: 34.4 g/dL (ref 30.0–36.0)
MCV: 92 fl (ref 78.0–100.0)
MONO ABS: 0.5 10*3/uL (ref 0.1–1.0)
Monocytes Relative: 5.3 % (ref 3.0–12.0)
Neutro Abs: 6.1 10*3/uL (ref 1.4–7.7)
Neutrophils Relative %: 59.2 % (ref 43.0–77.0)
Platelets: 460 10*3/uL — ABNORMAL HIGH (ref 150.0–400.0)
RBC: 4.95 Mil/uL (ref 4.22–5.81)
RDW: 12.9 % (ref 11.5–15.5)
WBC: 10.2 10*3/uL (ref 4.0–10.5)

## 2018-03-17 LAB — LIPID PANEL
Cholesterol: 261 mg/dL — ABNORMAL HIGH (ref 0–200)
HDL: 31.9 mg/dL — ABNORMAL LOW (ref >39.00)
LDL CALC: 192 mg/dL — AB (ref 0–99)
NONHDL: 228.67
Total CHOL/HDL Ratio: 8
Triglycerides: 181 mg/dL — ABNORMAL HIGH (ref 0.0–149.0)
VLDL: 36.2 mg/dL (ref 0.0–40.0)

## 2018-03-17 LAB — TSH: TSH: 1.52 u[IU]/mL (ref 0.35–4.50)

## 2018-03-17 LAB — COMPREHENSIVE METABOLIC PANEL
ALBUMIN: 4.6 g/dL (ref 3.5–5.2)
ALT: 29 U/L (ref 0–53)
AST: 22 U/L (ref 0–37)
Alkaline Phosphatase: 81 U/L (ref 39–117)
BUN: 10 mg/dL (ref 6–23)
CHLORIDE: 105 meq/L (ref 96–112)
CO2: 28 mEq/L (ref 19–32)
Calcium: 9.7 mg/dL (ref 8.4–10.5)
Creatinine, Ser: 0.92 mg/dL (ref 0.40–1.50)
GFR: 99.88 mL/min (ref >60.00)
Glucose, Bld: 109 mg/dL — ABNORMAL HIGH (ref 70–99)
Potassium: 4.1 mEq/L (ref 3.5–5.1)
SODIUM: 141 meq/L (ref 135–145)
Total Bilirubin: 0.3 mg/dL (ref 0.2–1.2)
Total Protein: 8.2 g/dL (ref 6.0–8.3)

## 2018-03-17 LAB — TESTOSTERONE: Testosterone: 222.51 ng/dL — ABNORMAL LOW (ref 300.00–890.00)

## 2018-03-17 MED ORDER — BUPROPION HCL ER (SR) 150 MG PO TB12: 150.0000 mg | ORAL_TABLET | Freq: Two times a day (BID) | ORAL | 2 refills | Status: DC

## 2018-03-17 NOTE — Patient Instructions (Signed)
Good to see you today Please stop at desk to see referral coordinator Follow up in 3 months to check cholesterol, smoking and concentration   High Cholesterol High cholesterol is a condition in which the blood has high levels of a white, waxy, fat-like substance (cholesterol). The human body needs small amounts of cholesterol. The liver makes all the cholesterol that the body needs. Extra (excess) cholesterol comes from the food that we eat. Cholesterol is carried from the liver by the blood through the blood vessels. If you have high cholesterol, deposits (plaques) may build up on the walls of your blood vessels (arteries). Plaques make the arteries narrower and stiffer. Cholesterol plaques increase your risk for heart attack and stroke. Work with your health care provider to keep your cholesterol levels in a healthy range. What increases the risk? This condition is more likely to develop in people who:  Eat foods that are high in animal fat (saturated fat) or cholesterol.  Are overweight.  Are not getting enough exercise.  Have a family history of high cholesterol.  What are the signs or symptoms? There are no symptoms of this condition. How is this diagnosed? This condition may be diagnosed from the results of a blood test.  If you are older than age 58, your health care provider may check your cholesterol every 4-6 years.  You may be checked more often if you already have high cholesterol or other risk factors for heart disease.  The blood test for cholesterol measures:  "Bad" cholesterol (LDL cholesterol). This is the main type of cholesterol that causes heart disease. The desired level for LDL is less than 100.  "Good" cholesterol (HDL cholesterol). This type helps to protect against heart disease by cleaning the arteries and carrying the LDL away. The desired level for HDL is 60 or higher.  Triglycerides. These are fats that the body can store or burn for energy. The desired  number for triglycerides is lower than 150.  Total cholesterol. This is a measure of the total amount of cholesterol in your blood, including LDL cholesterol, HDL cholesterol, and triglycerides. A healthy number is less than 200.  How is this treated? This condition is treated with diet changes, lifestyle changes, and medicines. Diet changes  This may include eating more whole grains, fruits, vegetables, nuts, and fish.  This may also include cutting back on red meat and foods that have a lot of added sugar. Lifestyle changes  Changes may include getting at least 40 minutes of aerobic exercise 3 times a week. Aerobic exercises include walking, biking, and swimming. Aerobic exercise along with a healthy diet can help you maintain a healthy weight.  Changes may also include quitting smoking. Medicines  Medicines are usually given if diet and lifestyle changes have failed to reduce your cholesterol to healthy levels.  Your health care provider may prescribe a statin medicine. Statin medicines have been shown to reduce cholesterol, which can reduce the risk of heart disease. Follow these instructions at home: Eating and drinking  If told by your health care provider:  Eat chicken (without skin), fish, veal, shellfish, ground [place] breast, and round or loin cuts of red meat.  Do not eat fried foods or fatty meats, such as hot dogs and salami.  Eat plenty of fruits, such as apples.  Eat plenty of vegetables, such as broccoli, potatoes, and carrots.  Eat beans, peas, and lentils.  Eat grains such as barley, rice, couscous, and bulgur wheat.  Eat pasta without  cream sauces.  Use skim or nonfat milk, and eat low-fat or nonfat yogurt and cheeses.  Do not eat or drink whole milk, cream, ice cream, egg yolks, or hard cheeses.  Do not eat stick margarine or tub margarines that contain trans fats (also called partially hydrogenated oils).  Do not eat saturated tropical oils, such as  coconut oil and palm oil.  Do not eat cakes, cookies, crackers, or other baked goods that contain trans fats.  General instructions  Exercise as directed by your health care provider. Increase your activity level with activities such as gardening, walking, and taking the stairs.  Take over-the-counter and prescription medicines only as told by your health care provider.  Do not use any products that contain nicotine or tobacco, such as cigarettes and e-cigarettes. If you need help quitting, ask your health care provider.  Keep all follow-up visits as told by your health care provider. This is important. Contact a health care provider if:  You are struggling to maintain a healthy diet or weight.  You need help to start on an exercise program.  You need help to stop smoking. Get help right away if:  You have chest pain.  You have trouble breathing. This information is not intended to replace advice given to you by your health care provider. Make sure you discuss any questions you have with your health care provider. Document Released: 05/31/2005 Document Revised: 12/27/2015 Document Reviewed: 11/29/2015 Elsevier Interactive Patient Education  2018 [place] 18-39 Years, Male Preventive care refers to lifestyle choices and visits with your health care provider that can promote health and wellness. What does preventive care include?  A yearly physical exam. This is also called an annual well check.  Dental exams once or twice a year.  Routine eye exams. Ask your health care provider how often you should have your eyes checked.  Personal lifestyle choices, including: ? Daily care of your teeth and gums. ? Regular physical activity. ? Eating a healthy diet. ? Avoiding tobacco and drug use. ? Limiting alcohol use. ? Practicing safe sex. What happens during an annual well check? The services and screenings done by your health care provider during your  annual well check will depend on your age, overall health, lifestyle risk factors, and family history of disease. Counseling Your health care provider may ask you questions about your:  Alcohol use.  Tobacco use.  Drug use.  Emotional well-being.  Home and relationship well-being.  Sexual activity.  Eating habits.  Work and work Statistician.  Screening You may have the following tests or measurements:  Height, weight, and BMI.  Blood pressure.  Lipid and cholesterol levels. These may be checked every 5 years starting at age 77.  Diabetes screening. This is done by checking your blood sugar (glucose) after you have not eaten for a while (fasting).  Skin check.  Hepatitis C blood test.  Hepatitis B blood test.  Sexually transmitted disease (STD) testing.  Discuss your test results, treatment options, and if necessary, the need for more tests with your health care provider. Vaccines Your health care provider may recommend certain vaccines, such as:  Influenza vaccine. This is recommended every year.  Tetanus, diphtheria, and acellular pertussis (Tdap, Td) vaccine. You may need a Td booster every 10 years.  Varicella vaccine. You may need this if you have not been vaccinated.  HPV vaccine. If you are 53 or younger, you may need three doses over 6 months.  Measles, mumps, and rubella (MMR) vaccine. You may need at least one dose of MMR.You may also need a second dose.  Pneumococcal 13-valent conjugate (PCV13) vaccine. You may need this if you have certain conditions and have not been vaccinated.  Pneumococcal polysaccharide (PPSV23) vaccine. You may need one or two doses if you smoke cigarettes or if you have certain conditions.  Meningococcal vaccine. One dose is recommended if you are age 75-21 years and a first-year college student living in a residence hall, or if you have one of several medical conditions. You may also need additional booster  doses.  Hepatitis A vaccine. You may need this if you have certain conditions or if you travel or work in places where you may be exposed to hepatitis A.  Hepatitis B vaccine. You may need this if you have certain conditions or if you travel or work in places where you may be exposed to hepatitis B.  Haemophilus influenzae type b (Hib) vaccine. You may need this if you have certain risk factors.  Talk to your health care provider about which screenings and vaccines you need and how often you need them. This information is not intended to replace advice given to you by your health care provider. Make sure you discuss any questions you have with your health care provider. Document Released: 07/27/2001 Document Revised: 02/18/2016 Document Reviewed: 04/01/2015 Elsevier Interactive Patient Education  [employer].

## 2018-03-17 NOTE — Progress Notes (Signed)
Subjective:    Patient ID: Jesse Rubio, male    DOB: Oct 31, 1985, 32 y.o.   MRN: [id number]  HPI This is a 32 yo male who presents today for CPE  Last CPE- unsure Tdap-08/27/15 Flu-declines Dental- regular Exercise-walks at work Diet- eats out 1/x week, drinks multiple regular sodas and energy drinks daily, drinks whole milk, eats a lot of block cheese. Eats vegetables daily.   Hyperlipidemia- both parents  Smoking cessation- used Chantix for a month, helped curb cravings, had bad dreams. Tired nicotine replacement.   Left foot pain- no improvement with metatarsal cookie, worsening  ADHD- has history of ADHD in childhood. Took ritalin as child. Came off of medication at 87-13 yo. Dropped out of HS and was home schooled. His boss has told him that he needs something to help him focus. Has difficulty getting through daly report at work. Wife bought him a book about ADHD in adults, but he didn't finish it.   Past Medical History:  Diagnosis Date  . Restless leg syndrome    Past Surgical History:  Procedure Laterality Date  . HERNIA REPAIR     inguinal hernia repair as a baby  . ORIF PATELLA Right 09/01/2015   Procedure: OPEN REDUCTION INTERNAL (ORIF) FIXATION RIGHT PATELLA;  Surgeon: Rod Can, MD;  Location: [facility];  Service: Orthopedics;  Laterality: Right;  Marland Kitchen VASECTOMY  2014   Family History  Problem Relation Age of Onset  . Hypertension Mother   . Cirrhosis Father    Social History   Tobacco Use  . Smoking status: Current Some Day Smoker    Packs/day: 1.00    Types: Cigarettes  . Smokeless tobacco: Former Network engineer Use Topics  . Alcohol use: Yes    Alcohol/week: 2.0 standard drinks    Types: 2 Cans of beer per week    Comment: occasional  . Drug use: Yes    Types: Oxycodone    Comment: suboxin       Review of Systems  Constitutional: Positive for fatigue.  HENT: Negative.   Eyes: Negative.   Respiratory: Negative.   Cardiovascular:  Negative.   Gastrointestinal: Negative.   Endocrine: Positive for heat intolerance.  Genitourinary:       ED- no decreased libido, difficulty with erection.   Musculoskeletal:       Chronic right knee, right foot, back.   Neurological: Positive for headaches (occasional).  Psychiatric/Behavioral: Positive for dysphoric mood and sleep disturbance (occasionally has difficulty going and staying asleep. ). The patient is nervous/anxious.        Objective:   Physical Exam Physical Exam  Constitutional: He is oriented to person, place, and time. He appears well-developed and well-nourished.  HENT:  Head: Normocephalic and atraumatic.  Right Ear: External ear normal.  Left Ear: External ear normal.  Nose: Nose normal.  Mouth/Throat: Oropharynx is clear and moist.  Eyes: Conjunctivae are normal. Pupils are equal, round, and reactive to light.  Neck: Normal range of motion. Neck supple.  Cardiovascular: Normal rate, regular rhythm, normal heart sounds and intact distal pulses.   Pulmonary/Chest: Effort normal and breath sounds normal.  Abdominal: Soft. Bowel sounds are normal. Hernia confirmed negative in the right inguinal area and confirmed negative in the left inguinal area.  Genitourinary: Testes normal and penis normal. Circumcised.  Musculoskeletal: Normal range of motion. He exhibits no edema or tenderness.       Cervical back: Normal.       Thoracic back: Normal.  Lumbar back: Normal.  Lymphadenopathy:    He has no cervical adenopathy.       Right: No inguinal adenopathy present.       Left: No inguinal adenopathy present.  Neurological: He is alert and oriented to person, place, and time.  Skin: Skin is warm and dry.  Psychiatric: He has a normal mood and affect. His behavior is normal. Judgment normal.  Vitals reviewed.     BP 138/88 (BP Location: Left Arm, Patient Position: Sitting, Cuff Size: Normal)   Pulse 91   Temp 98.1 F (36.7 C) (Oral)   Ht 6\' 1"  (1.854  m)   Wt 211 lb 12.8 oz (96.1 kg)   SpO2 97%   BMI 27.94 kg/m  Wt Readings from Last 3 Encounters:  03/17/18 211 lb 12.8 oz (96.1 kg)  03/10/18 214 lb 6.4 oz (97.3 kg)  11/14/17 210 lb (95.3 kg)       Assessment & Plan:  1. Annual physical exam - Discussed and encouraged healthy lifestyle choices- adequate sleep, regular exercise, stress management and healthy food choices.  - Reviewed labs   2. Low testosterone - Ambulatory referral to Urology  3. Erectile dysfunction, unspecified erectile dysfunction type - Ambulatory referral to Urology  4. Adult ADHD - will try bupropion for concentration and smoking cessation, discussed potential side effects - buPROPion (WELLBUTRIN SR) 150 MG 12 hr tablet; Take 1 tablet (150 mg total) by mouth 2 (two) times daily. When starting- 1 tablet in morning for 3 days then take two tablets daily. Take second dose no later than 6 pm.  Dispense: 60 tablet; Refill: 2  5. Tobacco abuse - buPROPion (WELLBUTRIN SR) 150 MG 12 hr tablet; Take 1 tablet (150 mg total) by mouth 2 (two) times daily. When starting- 1 tablet in morning for 3 days then take two tablets daily. Take second dose no later than 6 pm.  Dispense: 60 tablet; Refill: 2  6. Hypercholesterolemia with hypertriglyceridemia - Discussed diet and smoking cessation, will work on weight loss and will recheck in 3 months   Clarene Reamer, FNP-BC  [facility] at [facility], [place]  03/17/2018 5:01 PM

## 2018-05-30 ENCOUNTER — Other Ambulatory Visit: Payer: Self-pay | Admitting: Family Medicine

## 2018-05-30 NOTE — Telephone Encounter (Signed)
Last filled 03/17/18 #60 refills 2 Last OV 03/17/18  Medication is on allergy list.

## 2018-06-23 ENCOUNTER — Ambulatory Visit: Payer: Managed Care, Other (non HMO) | Admitting: Family Medicine

## 2018-06-28 ENCOUNTER — Other Ambulatory Visit: Payer: Self-pay | Admitting: Family Medicine

## 2018-06-28 NOTE — Telephone Encounter (Signed)
Electronic refill request Bupropion Berniece Andreas Last refill 05/31/18 #60 Last office visit 03/17/18 See allergy/contraindication

## 2018-07-28 ENCOUNTER — Other Ambulatory Visit: Payer: Self-pay | Admitting: Family Medicine

## 2018-08-26 ENCOUNTER — Other Ambulatory Visit: Payer: Self-pay | Admitting: Family Medicine

## 2018-08-28 NOTE — Telephone Encounter (Signed)
Please advise 

## 2018-09-08 ENCOUNTER — Encounter: Payer: Self-pay | Admitting: Family Medicine

## 2018-09-08 ENCOUNTER — Telehealth: Payer: Self-pay

## 2018-09-08 ENCOUNTER — Other Ambulatory Visit: Payer: Self-pay

## 2018-09-08 ENCOUNTER — Ambulatory Visit (INDEPENDENT_AMBULATORY_CARE_PROVIDER_SITE_OTHER): Payer: 59 | Admitting: Family Medicine

## 2018-09-08 DIAGNOSIS — J302 Other seasonal allergic rhinitis: Secondary | ICD-10-CM

## 2018-09-08 MED ORDER — FLUTICASONE PROPIONATE 50 MCG/ACT NA SUSP
2.0000 | Freq: Every day | NASAL | 6 refills | Status: DC
Start: 2018-09-08 — End: 2019-09-17

## 2018-09-08 NOTE — Progress Notes (Signed)
Virtual Visit via Telephone Note  I connected with Jesse Rubio on 09/08/18 at  3:00 PM EDT by telephone and verified that I am speaking with the correct person using two identifiers. Patient was in his vehicle and I was in my office. No one else was involved in the call.    I discussed the limitations, risks, security and privacy concerns of performing an evaluation and management service by telephone and the availability of in person appointments. I also discussed with the patient that there may be a patient responsible charge related to this service. The patient expressed understanding and agreed to proceed.   History of Present Illness: This is a 33 year old male who requests virtual appointment regarding 2 days of runny nose, sneezing.  He experiences seasonal allergies, usually in the spring.  He has been taking Benadryl at bedtime with temporary relief.  He denies fever, shortness of breath, wheeze.  He has clear nasal drainage, a small amount of nasal congestion and occasional cough.     Observations/Objective: Patient alert and oriented.  His conjunctiva were clear.  He did not sound congested.  Respiratory rate was normal.  No cough or wheeze witnessed.  Assessment and Plan: 1. Seasonal allergic rhinitis, unspecified trigger - Provided written and verbal information regarding diagnosis and treatment. -Discussed adding long-acting antihistamine -Provided him a summary via his MyChart -Return to work note written -Follow-up precautions reviewed - fluticasone (FLONASE) 50 MCG/ACT nasal spray; Place 2 sprays into both nostrils daily.  Dispense: 16 g; Refill: [place], FNP-BC  [facility], [facility]  09/08/2018 3:18 PM   Follow Up Instructions:    I discussed the assessment and treatment plan with the patient. The patient was provided an opportunity to ask questions and all were answered. The patient agreed with the plan and  demonstrated an understanding of the instructions.   The patient was advised to call back or seek an in-person evaluation if the symptoms worsen or if the condition fails to improve as anticipated.  I provided 6 minutes of non-face-to-face time during this encounter.   Elby Beck, FNP

## 2018-09-08 NOTE — Telephone Encounter (Signed)
Please see if patient agreeable to [facility] appointment.

## 2018-09-08 NOTE — Telephone Encounter (Signed)
Received IBC from patient requesting a return to work note stating he has allergies.  Patient reports he works at [employer] and was sent home today because he was sneezing. Patient denies having any other symptoms.   Last OV - 11/19  Patient has ability to complete Webex appt and can be reached at [phone].

## 2018-09-25 ENCOUNTER — Other Ambulatory Visit: Payer: Self-pay | Admitting: Family Medicine

## 2018-09-25 NOTE — Telephone Encounter (Signed)
Last filled 03/17/2018 with 2 refills... pt had video acute visit 08/2018... would you like pt to f/u about this medication

## 2018-12-24 ENCOUNTER — Other Ambulatory Visit: Payer: Self-pay | Admitting: Family Medicine

## 2018-12-25 NOTE — Telephone Encounter (Signed)
Debbie please advise, last refill 09/25/2018, last OV 03/17/2018 #60 2 refills

## 2018-12-25 NOTE — Telephone Encounter (Signed)
Please call and schedule appointment for med refill. Thanks

## 2018-12-26 NOTE — Telephone Encounter (Signed)
Appt scheduled

## 2018-12-29 ENCOUNTER — Encounter: Payer: Self-pay | Admitting: Family Medicine

## 2018-12-29 ENCOUNTER — Ambulatory Visit: Payer: 59 | Admitting: Family Medicine

## 2018-12-29 DIAGNOSIS — Z5329 Procedure and treatment not carried out because of patient's decision for other reasons: Secondary | ICD-10-CM

## 2018-12-29 DIAGNOSIS — Z91199 Patient's noncompliance with other medical treatment and regimen due to unspecified reason: Secondary | ICD-10-CM

## 2018-12-29 NOTE — Progress Notes (Signed)
The patient was scheduled for a virtual visit. He was called by CMA prior to visit and had virtual rooming. I invited him to Doxy meeting x 2 and called his mobile number x 2. He did not accept meeting invitation nor did he answer the phone. His voice mail box was full so I was unable to leave him a message.

## 2019-06-01 ENCOUNTER — Ambulatory Visit: Payer: Managed Care, Other (non HMO) | Admitting: Family Medicine

## 2019-06-01 ENCOUNTER — Other Ambulatory Visit: Payer: Self-pay

## 2019-06-01 ENCOUNTER — Encounter: Payer: Self-pay | Admitting: Family Medicine

## 2019-06-01 VITALS — BP 122/84 | HR 72 | Temp 98.4°F | Ht 73.0 in | Wt 226.4 lb

## 2019-06-01 DIAGNOSIS — L03811 Cellulitis of head [any part, except face]: Secondary | ICD-10-CM | POA: Diagnosis not present

## 2019-06-01 DIAGNOSIS — M952 Other acquired deformity of head: Secondary | ICD-10-CM

## 2019-06-01 DIAGNOSIS — Z720 Tobacco use: Secondary | ICD-10-CM | POA: Diagnosis not present

## 2019-06-01 DIAGNOSIS — F909 Attention-deficit hyperactivity disorder, unspecified type: Secondary | ICD-10-CM | POA: Diagnosis not present

## 2019-06-01 MED ORDER — BUPROPION HCL ER (SR) 150 MG PO TB12: 150.0000 mg | ORAL_TABLET | Freq: Two times a day (BID) | ORAL | 3 refills | Status: DC

## 2019-06-01 MED ORDER — CEPHALEXIN 500 MG PO CAPS: 500.0000 mg | ORAL_CAPSULE | Freq: Three times a day (TID) | ORAL | 0 refills | Status: DC

## 2019-06-01 NOTE — Progress Notes (Signed)
Subjective:    Patient ID: Jesse Rubio, male    DOB: May 23, 1986, 33 y.o.   MRN: [id number]  HPI Chief Complaint  Patient presents with  . Hair/Scalp Problem    indention on left scalp - painful. pt does not recall hitting his head. Not having any headaches.   . Nicotine Dependence    Pt requests a refill of his Wellbutrin - started smoking again   This is a 33 yo male who presents today for the above cc.   Patient has small indentation on top of his head. Not sure how long it has been there. Does not recall any recent or past trauma. Noticed a small bump on area that is painful with palpation. No headaches or visual changes.   Started smoking several months ago. Had been gaining weight but has lost some (about 20 pounds). Work is stressful with a lot of time out of town. He is enjoying it and it pays well. Feels like he has more energy and focus on bupropion and would like to go back on it.   Past Medical History:  Diagnosis Date  . Restless leg syndrome    Past Surgical History:  Procedure Laterality Date  . HERNIA REPAIR     inguinal hernia repair as a baby  . ORIF PATELLA Right 09/01/2015   Procedure: OPEN REDUCTION INTERNAL (ORIF) FIXATION RIGHT PATELLA;  Surgeon: Rod Can, MD;  Location: [facility];  Service: Orthopedics;  Laterality: Right;  Marland Kitchen VASECTOMY  2014   Family History  Problem Relation Age of Onset  . Hypertension Mother   . Cirrhosis Father    Social History   Tobacco Use  . Smoking status: Former Smoker    Packs/day: 1.00    Types: Cigarettes    Quit date: 07/31/2018    Years since quitting: 0.8  . Smokeless tobacco: Former Systems developer  . Tobacco comment: pt is using dip tobacco now  Substance Use Topics  . Alcohol use: Yes    Alcohol/week: 2.0 standard drinks    Types: 2 Cans of beer per week    Comment: occasional  . Drug use: Yes    Types: Oxycodone    Comment: suboxin      Review of Systems Per HPI    Objective:   Physical Exam Vitals  reviewed.  Constitutional:      General: He is not in acute distress.    Appearance: Normal appearance. He is normal weight. He is not ill-appearing, toxic-appearing or diaphoretic.  HENT:     Head:      Right Ear: External ear normal.     Left Ear: External ear normal.  Eyes:     Conjunctiva/sclera: Conjunctivae normal.  Cardiovascular:     Rate and Rhythm: Normal rate.  Pulmonary:     Effort: Pulmonary effort is normal.  Musculoskeletal:     Right lower leg: No edema.     Left lower leg: No edema.  Neurological:     Mental Status: He is alert and oriented to person, place, and time.  Psychiatric:        Mood and Affect: Mood normal.        Behavior: Behavior normal.        Thought Content: Thought content normal.        Judgment: Judgment normal.       BP 122/84 (BP Location: Left Arm, Patient Position: Sitting, Cuff Size: Normal)   Pulse 72   Temp 98.4 F (36.9 C) (  Temporal)   Ht 6\' 1"  (1.854 m)   Wt 226 lb 6.4 oz (102.7 kg)   SpO2 98%   BMI 29.87 kg/m  Wt Readings from Last 3 Encounters:  06/01/19 226 lb 6.4 oz (102.7 kg)  03/17/18 211 lb 12.8 oz (96.1 kg)  03/10/18 214 lb 6.4 oz (97.3 kg)       Assessment & Plan:  1. 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 Adult ADHD - buPROPion (WELLBUTRIN SR) 150 MG 12 hr tablet; Take 1 tablet (150 mg total) by mouth 2 (two) times daily.  Dispense: 180 tablet; Refill: 3  3. Tobacco abuse - buPROPion (WELLBUTRIN SR) 150 MG 12 hr tablet; Take 1 tablet (150 mg total) by mouth 2 (two) times daily.  Dispense: 180 tablet; Refill: 3 -discussed/encouraged cessation  4. Cellulitis of head except face - provided wait and see antibiotic if he develops increased erythema, pain over weekend - cephALEXin (KEFLEX) 500 MG capsule; Take 1 capsule (500 mg total) by mouth 3 (three) times daily.  Dispense: 21 capsule; Refill: 0  - overdue for CPE/labs, wishes to schedule after  first of the year This visit occurred during the SARS-CoV-2 public health emergency.  Safety protocols were in place, including screening questions prior to the visit, additional usage of staff PPE, and extensive cleaning of exam room while observing appropriate contact time as indicated for disinfecting solutions.    Clarene Reamer, FNP-BC  [facility]  06/02/2019 7:32 AM

## 2019-06-01 NOTE — Patient Instructions (Addendum)
Good to see you today  Follow up in 6 months for your annual exam  If head area is red, increased pain, take antibiotic. Let me know, can send a picture.   Take 1 wellbutrin in morning for 3 days then increase to twice a day. Don't take too late in the evening or it will keep you awake.

## 2019-06-02 ENCOUNTER — Encounter: Payer: Self-pay | Admitting: Family Medicine

## 2019-06-27 ENCOUNTER — Encounter: Payer: Self-pay | Admitting: Family Medicine

## 2019-06-27 NOTE — Telephone Encounter (Signed)
Please advise, thanks.

## 2019-06-30 ENCOUNTER — Other Ambulatory Visit: Payer: Self-pay | Admitting: Family Medicine

## 2019-06-30 DIAGNOSIS — M952 Other acquired deformity of head: Secondary | ICD-10-CM

## 2019-07-12 ENCOUNTER — Ambulatory Visit
Admission: RE | Admit: 2019-07-12 | Discharge: 2019-07-12 | Disposition: A | Discharge status: Home / Self Care | Payer: Managed Care, Other (non HMO) | Source: Ambulatory Visit | Attending: Family Medicine | Admitting: Family Medicine

## 2019-07-12 ENCOUNTER — Other Ambulatory Visit: Payer: Self-pay

## 2019-07-12 ENCOUNTER — Encounter: Payer: Self-pay | Admitting: Family Medicine

## 2019-07-12 DIAGNOSIS — M952 Other acquired deformity of head: Secondary | ICD-10-CM | POA: Diagnosis present

## 2019-09-06 ENCOUNTER — Ambulatory Visit: Payer: 59 | Attending: Internal Medicine

## 2019-09-06 DIAGNOSIS — Z23 Encounter for immunization: Secondary | ICD-10-CM

## 2019-09-06 NOTE — Progress Notes (Signed)
   Covid-19 Vaccination Clinic  Name:  Jesse Rubio    MRN: [id number] DOB: 1985-11-29  09/06/2019  Mr. Payment was observed post Covid-19 immunization for 15 minutes without incident. He was provided with Vaccine Information Sheet and instruction to access the V-Safe system.   [name] was instructed to call 911 with any severe reactions post vaccine: Marland Kitchen Difficulty breathing  . Swelling of face and throat  . A fast heartbeat  . A bad rash all over body  . Dizziness and weakness   Immunizations Administered    Name Date Dose VIS Date Route   Pfizer COVID-19 Vaccine 09/06/2019 10:24 AM 0.3 mL 05/25/2019 Intramuscular   Manufacturer: [place]   Lot: [id number]   [place]: [id number]

## 2019-09-17 ENCOUNTER — Other Ambulatory Visit: Payer: Self-pay | Admitting: Family Medicine

## 2019-09-17 DIAGNOSIS — J302 Other seasonal allergic rhinitis: Secondary | ICD-10-CM

## 2019-09-17 NOTE — Telephone Encounter (Signed)
Patient was seen on 09/08/18 for seasonal allergies - and this Rx was sent in that date for 16g with 6 refills.  Jackelyn Poling, is this ok to refill?

## 2019-10-01 ENCOUNTER — Ambulatory Visit: Payer: 59 | Attending: Internal Medicine

## 2019-10-01 ENCOUNTER — Ambulatory Visit: Payer: 59

## 2019-10-01 DIAGNOSIS — Z23 Encounter for immunization: Secondary | ICD-10-CM

## 2019-10-01 NOTE — Progress Notes (Signed)
   Covid-19 Vaccination Clinic  Name:  Jesse Rubio    MRN: [id number] DOB: September 26, 1985  10/01/2019  [name] was observed post Covid-19 immunization for 15 minutes without incident. He was provided with Vaccine Information Sheet and instruction to access the V-Safe system.   [name] was instructed to call 911 with any severe reactions post vaccine: Marland Kitchen Difficulty breathing  . Swelling of face and throat  . A fast heartbeat  . A bad rash all over body  . Dizziness and weakness   Immunizations Administered    Name Date Dose VIS Date Route   Pfizer COVID-19 Vaccine 10/01/2019  8:30 AM 0.3 mL 08/08/2018 Intramuscular   Manufacturer: [place]   Lot: [id number]   [place]: [id number]

## 2019-12-20 ENCOUNTER — Emergency Department
Admission: EM | Admit: 2019-12-20 | Discharge: 2019-12-20 | Disposition: A | Discharge status: Home / Self Care | Payer: 59 | Attending: Emergency Medicine | Admitting: Emergency Medicine

## 2019-12-20 ENCOUNTER — Emergency Department: Payer: 59

## 2019-12-20 ENCOUNTER — Encounter: Payer: Self-pay | Admitting: Emergency Medicine

## 2019-12-20 ENCOUNTER — Other Ambulatory Visit: Payer: Self-pay

## 2019-12-20 DIAGNOSIS — Y939 Activity, unspecified: Secondary | ICD-10-CM | POA: Diagnosis not present

## 2019-12-20 DIAGNOSIS — S61210A Laceration without foreign body of right index finger without damage to nail, initial encounter: Secondary | ICD-10-CM | POA: Diagnosis not present

## 2019-12-20 DIAGNOSIS — Y929 Unspecified place or not applicable: Secondary | ICD-10-CM | POA: Diagnosis not present

## 2019-12-20 DIAGNOSIS — W260XXA Contact with knife, initial encounter: Secondary | ICD-10-CM | POA: Insufficient documentation

## 2019-12-20 DIAGNOSIS — Z87891 Personal history of nicotine dependence: Secondary | ICD-10-CM | POA: Insufficient documentation

## 2019-12-20 DIAGNOSIS — Y999 Unspecified external cause status: Secondary | ICD-10-CM | POA: Insufficient documentation

## 2019-12-20 MED ORDER — LIDOCAINE HCL (PF) 1 % IJ SOLN
5.0000 mL | Freq: Once | INTRAMUSCULAR | Status: AC
  Administered 2019-12-20: 5 mL
  Filled 2019-12-20: qty 5

## 2019-12-20 MED ORDER — BACITRACIN-NEOMYCIN-POLYMYXIN 400-5-5000 EX OINT
TOPICAL_OINTMENT | Freq: Once | CUTANEOUS | Status: AC
  Administered 2019-12-20: 1 via TOPICAL
  Filled 2019-12-20: qty 1

## 2019-12-20 NOTE — ED Triage Notes (Addendum)
Pt presents to ED with laceration to his right hand on the top of his first digit. Pt reports he was washing dishes and cut his finger with a knife. Bleeding currently; gauze placed.

## 2019-12-20 NOTE — ED Notes (Signed)
E-signature not working at this time. Pt verbalized understanding of D/C instructions, prescriptions and follow up care with no further questions at this time. Pt in NAD and ambulatory at time of D/C.  

## 2019-12-20 NOTE — ED Provider Notes (Signed)
[facility] Emergency Department Provider Note ____________________________________________  Time seen: 2045  I have reviewed the triage vital signs and the nursing notes.  HISTORY  Chief Complaint  Extremity Laceration   HPI Jesse Rubio is a 34 y.o. male presents to the ER today with complaint of laceration to right index finger.  He reports this occurred 1 hour PTA after cutting his finger on a knife.  He was unable to control the bleeding so he presented to the ER.  He has been applying pressure.  He did not take any medication PTA but does endorse drinking a few beers this afternoon.  His last tetanus was 08/2015.  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/2017   Rectal bleeding 10/06/2015   Closed fracture of zygomatic arch ([place]) 08/31/2015   Cough 05/23/2015   Acute pharyngitis 10/16/2013   Chronic lower back pain 07/12/2013   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 Authorizing Provider  buPROPion (WELLBUTRIN SR) 150 MG 12 hr tablet Take 1 tablet (150 mg total) by mouth 2 (two) times daily. 06/01/19   Elby Beck, FNP  cephALEXin (KEFLEX) 500 MG capsule Take 1 capsule (500 mg total) by mouth 3 (three) times daily. 06/01/19   Elby Beck, FNP  clomiPHENE (CLOMID) 50 MG tablet Take 25 mg by mouth daily. 08/18/18   [provider]  fluticasone (FLONASE) 50 MCG/ACT nasal spray SPRAY 2 SPRAYS INTO EACH NOSTRIL EVERY DAY 09/17/19   Elby Beck, FNP  sildenafil (REVATIO) 20 MG tablet TAKE 1 TO 5 TABLETS BY MOUTH AS NEEDED FOR ERECTILE DYSFUNCTION 04/20/18   [provider]    Allergies Chantix [varenicline]  Family History  Problem Relation Age of Onset   Hypertension Mother    Cirrhosis Father     Social History Social History   Tobacco Use   Smoking status: Former Smoker    Packs/day: 1.00    Types: Cigarettes    Quit date: 07/31/2018    Years since quitting: 1.3   Smokeless tobacco: Current User    Types: Snuff   Tobacco comment: pt is using dip tobacco now  Vaping Use   Vaping Use: Former  Substance Use Topics   Alcohol use: Yes    Alcohol/week: 2.0 standard drinks    Types: 2 Cans of beer per week    Comment: occasional   Drug use: Yes    Types: Oxycodone  Comment: suboxin    Review of Systems  Constitutional: Negative for fever, chills or body aches. Cardiovascular: Negative for chest pain or chest tightness. Respiratory: Negative for cough or shortness of breath. Skin: Positive for laceration to right index finger. Neurological: Negative for focal weakness, tingling or numbness. ____________________________________________  PHYSICAL EXAM:  VITAL SIGNS: ED Triage Vitals [12/20/19 2025]  Enc Vitals Group     BP      Pulse      Resp      Temp      Temp src      SpO2      Weight 230 lb (104.3 kg)     Height 6' (1.829 m)     Head Circumference       Peak Flow      Pain Score 3     Pain Loc      Pain Edu?      Excl. in [place]?     Constitutional: Alert and oriented. Well appearing and in no distress. Cardiovascular: Normal rate, regular rhythm.  Radial pulses 2+ bilaterally Respiratory: Normal respiratory effort. No wheezes/rales/rhonchi. Musculoskeletal: Normal flexion and extension of the right index finger.  No pain with palpation of the right index finger. Neurologic:   Normal speech and language. No gross focal neurologic deficits are appreciated. Skin: 1 cm linear laceration with arterial bleed noted over the PIP, right index finger. ____________________________________________   RADIOLOGY  Imaging Orders     DG Finger Index Right IMPRESSION: Negative.    ____________________________________________  PROCEDURES  .Marland KitchenLaceration Repair  Date/Time: 12/20/2019 9:22 PM Performed by: Jearld Fenton, NP Authorized by: Jearld Fenton, NP   Consent:    Consent obtained:  Verbal   Consent given by:  Patient   Risks discussed:  Infection, pain and poor cosmetic result   Alternatives discussed:  No treatment Anesthesia (see MAR for exact dosages):    Anesthesia method:  Local infiltration   Local anesthetic:  Lidocaine 1% w/o epi Laceration details:    Location:  Finger   Finger location:  R index finger   Length (cm):  1 Repair type:    Repair type:  Intermediate Pre-procedure details:    Preparation:  Patient was prepped and draped in usual sterile fashion and imaging obtained to evaluate for foreign bodies Exploration:    Hemostasis achieved with:  Direct pressure   Wound exploration: wound explored through full range of motion and entire depth of wound probed and visualized     Contaminated: no   Treatment:    Area cleansed with:  Betadine and saline   Amount of cleaning:  Standard   Irrigation solution:  Sterile saline   Irrigation method:  Syringe   Visualized foreign bodies/material removed: no   Skin  repair:    Repair method:  Sutures   Suture size:  4-0   Suture material:  Nylon   Suture technique:  Simple interrupted   Number of sutures:  4 Approximation:    Approximation:  Close Post-procedure details:    Dressing:  Antibiotic ointment and splint for protection   Patient tolerance of procedure:  Tolerated well, no immediate complications   ____________________________________________  INITIAL IMPRESSION / ASSESSMENT AND PLAN / ED COURSE  Laceration of Right Index Finger:  Xray right finger negative Bleeding controlled with Surgicel Laceration repaired by this provider- see procedure note Follow up with PCP in 1 week for suture removal.     I reviewed the patient's prescription history over the  last 12 months in the multi-state controlled substances database(s) that includes [place], [place], [place], [place], [place], [place], [place], [place], [place], [place], [place], [place], [place], and [place].  Results were notable for no recent controlled substances. ____________________________________________  FINAL CLINICAL IMPRESSION(S) / ED DIAGNOSES  Final diagnoses:  Laceration of right index finger without foreign body without damage to nail, initial encounter      Jearld Fenton, NP 12/20/19 2146    Earleen Newport, MD 12/20/19 [phone]

## 2019-12-20 NOTE — Discharge Instructions (Signed)
You were seen today for a laceration to your right index finger.  This was complicated by an arterial bleed.  We were able to control the bleeding with Surgicel.  He received 4 sutures that need to be removed in 1 week.  Please monitor for increased redness, swelling, pain or discharge.  We have placed you in a splint for protection.  Please wear overnight tonight and during the day tomorrow.  You may then wear it as needed thereafter.

## 2020-02-26 ENCOUNTER — Encounter: Payer: Self-pay | Admitting: Podiatry

## 2020-02-26 ENCOUNTER — Other Ambulatory Visit: Payer: Self-pay

## 2020-02-26 ENCOUNTER — Ambulatory Visit (INDEPENDENT_AMBULATORY_CARE_PROVIDER_SITE_OTHER): Payer: 59 | Admitting: Podiatry

## 2020-02-26 ENCOUNTER — Ambulatory Visit (INDEPENDENT_AMBULATORY_CARE_PROVIDER_SITE_OTHER): Payer: 59

## 2020-02-26 DIAGNOSIS — M778 Other enthesopathies, not elsewhere classified: Secondary | ICD-10-CM

## 2020-02-26 DIAGNOSIS — M84374A Stress fracture, right foot, initial encounter for fracture: Secondary | ICD-10-CM | POA: Diagnosis not present

## 2020-02-26 DIAGNOSIS — T148XXA Other injury of unspecified body region, initial encounter: Secondary | ICD-10-CM | POA: Diagnosis not present
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
## 2020-02-29 ENCOUNTER — Ambulatory Visit: Payer: Managed Care, Other (non HMO) | Admitting: Podiatry

## 2020-03-27 ENCOUNTER — Ambulatory Visit: Payer: 59 | Admitting: Podiatry

## 2020-04-11 ENCOUNTER — Other Ambulatory Visit: Payer: Self-pay | Admitting: Family Medicine

## 2020-04-11 DIAGNOSIS — F909 Attention-deficit hyperactivity disorder, unspecified type: Secondary | ICD-10-CM

## 2020-04-11 DIAGNOSIS — Z720 Tobacco use: Secondary | ICD-10-CM

## 2020-04-14 NOTE — Telephone Encounter (Signed)
Refill request Bupropion Last refill 06/01/19 #180/3 Last office visit 06/01/19

## 2020-04-29 ENCOUNTER — Other Ambulatory Visit: Payer: Self-pay | Admitting: Family Medicine

## 2020-04-29 DIAGNOSIS — Z720 Tobacco use: Secondary | ICD-10-CM

## 2020-04-29 DIAGNOSIS — F909 Attention-deficit hyperactivity disorder, unspecified type: Secondary | ICD-10-CM

## 2020-05-29 ENCOUNTER — Other Ambulatory Visit: Payer: Self-pay | Admitting: Family Medicine

## 2020-05-29 DIAGNOSIS — F909 Attention-deficit hyperactivity disorder, unspecified type: Secondary | ICD-10-CM

## 2020-05-29 DIAGNOSIS — Z720 Tobacco use: Secondary | ICD-10-CM

## 2020-06-12 ENCOUNTER — Encounter: Payer: Self-pay | Admitting: Family Medicine

## 2020-06-12 ENCOUNTER — Other Ambulatory Visit: Payer: Self-pay

## 2020-06-12 ENCOUNTER — Ambulatory Visit: Payer: 59 | Admitting: Family Medicine

## 2020-06-12 VITALS — BP 144/92 | HR 74 | Temp 97.4°F | Ht 73.0 in | Wt 234.1 lb

## 2020-06-12 DIAGNOSIS — G5600 Carpal tunnel syndrome, unspecified upper limb: Secondary | ICD-10-CM | POA: Insufficient documentation

## 2020-06-12 DIAGNOSIS — G5603 Carpal tunnel syndrome, bilateral upper limbs: Secondary | ICD-10-CM

## 2020-06-12 DIAGNOSIS — F909 Attention-deficit hyperactivity disorder, unspecified type: Secondary | ICD-10-CM

## 2020-06-12 MED ORDER — MELOXICAM 15 MG PO TABS: 15.0000 mg | ORAL_TABLET | Freq: Every day | ORAL | 0 refills | Status: DC | PRN

## 2020-06-12 NOTE — Assessment & Plan Note (Addendum)
Classic symptoms with pos tinel and phalen tests  inst to wear wrist splints at night  Px meloxicam to try Given handout  Will start doing some of the PT exercises he has Ref made to neuro for eval and tx (needs ncv)  ? If may need surg eventually  Works with hands-electrician

## 2020-06-12 NOTE — Progress Notes (Signed)
Subjective:    Patient ID: Jesse Rubio, male    DOB: June 05, 1986, 34 y.o.   MRN: [id number]  This visit occurred during the SARS-CoV-2 public health emergency.  Safety protocols were in place, including screening questions prior to the visit, additional usage of staff PPE, and extensive cleaning of exam room while observing appropriate contact time as indicated for disinfecting solutions.    HPI Symptoms for 2 months  Suspects carpal tunnel  Hurts hands to elbows  Both equal  Is R handed  Does electrical work   Has numbness and tingling in fingers and middle of palm     Has worn wrist splints (given by doctor in [place]) and told this was likely carpal tunnel He was unable to wear them during work  Not sleeping with them  Not taking any medication  Has not had ncv tests    No joint swelling  Hands feel tight  They cramp easily   A year ago had gout in toe -got better quickly, no other chronic joint issues  Struggling with ADD Would like to discuss dx and tx as well  Unsure when last tested -? Had it in childhood  Interested in medication if it would be helpful  Does not feel like anxiety or stress play a role  Patient Active Problem List   Diagnosis Date Noted  . Carpal tunnel syndrome 06/12/2020  . Low testosterone 03/17/2018  . Adult ADHD 03/17/2018  . Hypercholesterolemia with hypertriglyceridemia 03/17/2018  . Chronic pain of right knee (Primary Area of Pain) 11/14/2017  . Chronic pain syndrome 11/14/2017  . Opiate use 11/14/2017  . Substance use disorder 11/14/2017  . History of patellar fracture 02/11/2017  . 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 Rectal bleeding 10/06/2015  . Closed fracture of zygomatic arch ([place]) 08/31/2015  . Cough 05/23/2015  . Chronic lower back pain 07/12/2013  . 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 Tobacco use 03/23/2013  . Polyarthralgia 03/23/2013  . Other malaise and fatigue 03/23/2013  . Insomnia 03/23/2013  . Erectile  dysfunction 03/23/2013  . Left knee pain 03/23/2013   Past Medical History:  Diagnosis Date  . Restless leg syndrome    Past Surgical History:  Procedure Laterality Date  . HERNIA REPAIR     inguinal hernia repair as a baby  . ORIF PATELLA Right 09/01/2015   Procedure: OPEN REDUCTION INTERNAL (ORIF) FIXATION RIGHT PATELLA;  Surgeon: Rod Can, MD;  Location: [facility];  Service: Orthopedics;  Laterality: Right;  Marland Kitchen VASECTOMY  2014   Social History   Tobacco Use  . Smoking status: Former Smoker    Packs/day: 1.00    Types: Cigarettes    Quit date: 07/31/2018    Years since quitting: 1.8  . Smokeless tobacco: Current User    Types: Snuff  . Tobacco comment: pt is using dip tobacco now  Vaping Use  . Vaping Use: Former  Substance Use Topics  . Alcohol use: Yes    Alcohol/week: 2.0 standard drinks    Types: 2 Cans of beer per week    Comment: occasional  . Drug use: Yes    Types: Oxycodone    Comment: suboxin   Family History  Problem Relation Age of Onset  . Hypertension Mother   . Cirrhosis Father    Allergies  Allergen Reactions  . Chantix [Varenicline]     Disturbing dreams   No current outpatient medications on file prior to visit.   No current facility-administered medications  on file prior to visit.     Review of Systems  Constitutional: Negative for activity change, appetite change, fatigue, fever and unexpected weight change.  HENT: Negative for congestion, rhinorrhea, sore throat and trouble swallowing.   Eyes: Negative for pain, redness, itching and visual disturbance.  Respiratory: Negative for cough, chest tightness, shortness of breath and wheezing.   Cardiovascular: Negative for chest pain and palpitations.  Gastrointestinal: Negative for abdominal pain, blood in stool, constipation, diarrhea and nausea.  Endocrine: Negative for cold intolerance, heat intolerance, polydipsia and polyuria.  Genitourinary: Negative for difficulty urinating, dysuria,  frequency and urgency.  Musculoskeletal: Negative for arthralgias, joint swelling and myalgias.  Skin: Negative for pallor and rash.  Neurological: Positive for numbness. Negative for dizziness, tremors, weakness and headaches.       Numbness/tingling and pain in palms /fingers   Hematological: Negative for adenopathy. Does not bruise/bleed easily.  Psychiatric/Behavioral: Negative for decreased concentration and dysphoric mood. The patient is not nervous/anxious.        Objective:   Physical Exam Constitutional:      General: He is not in acute distress.    Appearance: Normal appearance. He is obese. He is not ill-appearing.  Eyes:     Conjunctiva/sclera: Conjunctivae normal.     Pupils: Pupils are equal, round, and reactive to light.  Neck:     Vascular: No carotid bruit.  Cardiovascular:     Rate and Rhythm: Normal rate and regular rhythm.     Pulses: Normal pulses.     Heart sounds: Normal heart sounds.  Pulmonary:     Effort: Pulmonary effort is normal. No respiratory distress.     Breath sounds: Normal breath sounds.  Musculoskeletal:     Right wrist: No tenderness or crepitus. Normal range of motion.     Left wrist: No tenderness or crepitus. Normal range of motion.     Right hand: No swelling, deformity or tenderness. Normal range of motion. Normal strength. There is no disruption of two-point discrimination. Normal capillary refill. Normal pulse.     Left hand: No swelling, deformity or tenderness. Normal range of motion. Normal strength. There is no disruption of two-point discrimination. Normal capillary refill. Normal pulse.     Cervical back: Normal range of motion and neck supple. No tenderness.     Comments: Some pain with full grip  sens to light touch is decreased in R hand 2,3 rd fingers (mild)  Lymphadenopathy:     Cervical: No cervical adenopathy.  Skin:    General: Skin is warm and dry.     Coloration: Skin is not pale.     Findings: No erythema or rash.   Neurological:     Mental Status: He is alert.     Coordination: Coordination normal.     Deep Tendon Reflexes: Reflexes normal.     Comments: Positive tinel and phalen tests bilat hands   Psychiatric:        Attention and Perception: He is attentive.        Mood and Affect: Mood normal.     Comments: Pleasant  Attentive  Discusses symptoms candidly           Assessment & Plan:   Problem List Items Addressed This Visit      Nervous and Auditory   Carpal tunnel syndrome - Primary    Classic symptoms with pos tinel and phalen tests  inst to wear wrist splints at night  Px meloxicam to try Given handout  Will start doing some of the PT exercises he has Ref made to neuro for eval and tx (needs ncv)  ? If may need surg eventually  Works with [employer]      Relevant Orders   Ambulatory referral to Neurology     Other   Adult ADHD    Pt is interested in testing /treatment  Had promotion at work-will inv more cognitive exercise      Relevant Orders   Ambulatory referral to Psychology

## 2020-06-12 NOTE — Patient Instructions (Addendum)
Wear wrist splints at night  Try the meloxicam with food daily as needed Do the exercises you have  I placed a referral to neurology- you will get a call   I will place a referral for ADD testing and you will get a call as well

## 2020-06-12 NOTE — Assessment & Plan Note (Signed)
Pt is interested in testing /treatment  Had promotion at work-will inv more cognitive exercise

## 2020-06-18 ENCOUNTER — Encounter: Payer: Self-pay | Admitting: Neurology

## 2020-06-30 ENCOUNTER — Other Ambulatory Visit: Payer: Self-pay | Admitting: Family Medicine

## 2020-07-01 NOTE — Telephone Encounter (Signed)
Please advise 

## 2020-07-02 NOTE — Telephone Encounter (Signed)
Pt said he does a about a week or so left of med he was just trying to fill it before this weekend due to weather but he doesn't need it right now if Dr. Glori Bickers said it's to early, he said he knows he has at least 7-8 tabs left

## 2020-07-02 NOTE — Telephone Encounter (Signed)
This looks early. Can see if he is traveling or needing early for a particular reason  thanks

## 2020-07-02 NOTE — Telephone Encounter (Signed)
Thanks- send request back when due

## 2020-07-02 NOTE — Telephone Encounter (Signed)
??   If to soon but last OV was hand pain appt with Dr. Glori Bickers on 06/12/20, no est care appt with new PCP yet so will route to Dr. Glori Bickers who prescribed Rx, please advise   Filled on 06/12/20 #30 tabs 0 refills

## 2020-07-10 NOTE — Telephone Encounter (Addendum)
Sending refill request back to Dr. Glori Bickers

## 2020-07-22 DIAGNOSIS — R41840 Attention and concentration deficit: Secondary | ICD-10-CM | POA: Diagnosis not present

## 2020-08-30 ENCOUNTER — Other Ambulatory Visit: Payer: Self-pay | Admitting: Family Medicine

## 2020-09-01 NOTE — Telephone Encounter (Signed)
Pt had an acute appt with Dr. Glori Bickers on 06/12/20, med was given at that appt , Dr. Glori Bickers refilled med once since then on 07/10/20 #30 tabs with 1 refill, please advise

## 2020-09-02 DIAGNOSIS — F909 Attention-deficit hyperactivity disorder, unspecified type: Secondary | ICD-10-CM | POA: Diagnosis not present

## 2020-09-08 ENCOUNTER — Ambulatory Visit: Payer: 59 | Admitting: Neurology

## 2020-10-30 DIAGNOSIS — Z20822 Contact with and (suspected) exposure to covid-19: Secondary | ICD-10-CM | POA: Diagnosis not present

## 2020-10-31 DIAGNOSIS — F909 Attention-deficit hyperactivity disorder, unspecified type: Secondary | ICD-10-CM | POA: Diagnosis not present

## 2021-01-03 ENCOUNTER — Other Ambulatory Visit: Payer: Self-pay | Admitting: Family Medicine

## 2021-01-05 ENCOUNTER — Other Ambulatory Visit: Payer: Self-pay | Admitting: Family

## 2021-01-05 MED ORDER — MELOXICAM 15 MG PO TABS: 15.0000 mg | ORAL_TABLET | Freq: Every day | ORAL | 1 refills | Status: AC | PRN

## 2021-01-05 NOTE — Telephone Encounter (Signed)
Last OV was an acute appt for hand and elbow pain on 06/12/20, last filled on 09/01/20 #30 tabs with 1 refill, will route to the pool for review

## 2021-01-07 DIAGNOSIS — F909 Attention-deficit hyperactivity disorder, unspecified type: Secondary | ICD-10-CM | POA: Diagnosis not present

## 2021-02-05 DIAGNOSIS — F909 Attention-deficit hyperactivity disorder, unspecified type: Secondary | ICD-10-CM | POA: Diagnosis not present

## 2021-02-27 DIAGNOSIS — F909 Attention-deficit hyperactivity disorder, unspecified type: Secondary | ICD-10-CM | POA: Diagnosis not present

## 2021-03-06 ENCOUNTER — Other Ambulatory Visit: Payer: Self-pay | Admitting: Family

## 2021-04-04 ENCOUNTER — Other Ambulatory Visit: Payer: Self-pay | Admitting: Family

## 2023-01-20 ENCOUNTER — Ambulatory Visit: Payer: No Typology Code available for payment source | Admitting: Nurse Practitioner

## 2023-01-21 ENCOUNTER — Encounter: Payer: Self-pay | Admitting: Family Medicine

## 2023-03-08 ENCOUNTER — Ambulatory Visit: Payer: No Typology Code available for payment source | Admitting: Nurse Practitioner

## 2023-08-23 ENCOUNTER — Ambulatory Visit: Admitting: Family

## 2023-09-27 ENCOUNTER — Ambulatory Visit: Admitting: General Practice

## 2023-09-27 DIAGNOSIS — Z7689 Persons encountering health services in other specified circumstances: Secondary | ICD-10-CM
